# Patient Record
Sex: Male | Born: 1965
Health system: Southern US, Community
[De-identification: ages and names within clinical notes are randomized; demographics above are authoritative.]

## PROBLEM LIST (undated history)

## (undated) DIAGNOSIS — G4730 Sleep apnea, unspecified: Secondary | ICD-10-CM

## (undated) DIAGNOSIS — F17200 Nicotine dependence, unspecified, uncomplicated: Secondary | ICD-10-CM

## (undated) DIAGNOSIS — C44310 Basal cell carcinoma of skin of unspecified parts of face: Secondary | ICD-10-CM

## (undated) DIAGNOSIS — J342 Deviated nasal septum: Secondary | ICD-10-CM

## (undated) DIAGNOSIS — E785 Hyperlipidemia, unspecified: Secondary | ICD-10-CM

## (undated) HISTORY — DX: Nicotine dependence, unspecified, uncomplicated: F17.200

## (undated) HISTORY — PX: WISDOM TOOTH EXTRACTION: SHX21

## (undated) HISTORY — DX: Basal cell carcinoma of skin of unspecified parts of face: C44.310

## (undated) HISTORY — DX: Hyperlipidemia, unspecified: E78.5

## (undated) HISTORY — DX: Sleep apnea, unspecified: G47.30

## (undated) HISTORY — DX: Deviated nasal septum: J34.2

## (undated) HISTORY — PX: VASECTOMY: SHX75

---

## 2004-09-28 ENCOUNTER — Ambulatory Visit: Payer: Self-pay | Admitting: Internal Medicine

## 2005-03-15 ENCOUNTER — Ambulatory Visit: Payer: Self-pay | Admitting: Family Medicine

## 2008-09-06 ENCOUNTER — Encounter: Payer: Self-pay | Admitting: Family Medicine

## 2009-01-06 ENCOUNTER — Encounter: Payer: Self-pay | Admitting: Family Medicine

## 2009-05-09 ENCOUNTER — Encounter: Payer: Self-pay | Admitting: Family Medicine

## 2010-03-05 ENCOUNTER — Ambulatory Visit: Payer: Self-pay | Admitting: Family Medicine

## 2010-03-05 DIAGNOSIS — R5381 Other malaise: Secondary | ICD-10-CM

## 2010-03-05 DIAGNOSIS — R5383 Other fatigue: Secondary | ICD-10-CM

## 2010-04-02 ENCOUNTER — Emergency Department (HOSPITAL_COMMUNITY): Admission: EM | Admit: 2010-04-02 | Discharge: 2010-04-02 | Payer: Self-pay | Admitting: Emergency Medicine

## 2010-04-04 ENCOUNTER — Telehealth: Payer: Self-pay | Admitting: Family Medicine

## 2010-09-12 NOTE — Letter (Signed)
Summary: [facility]   Imported By: Lanelle Bal 03/22/2010 13:57:03  _____________________________________________________________________  External Attachment:    Type:   Image     Comment:   External Document

## 2010-09-12 NOTE — Assessment & Plan Note (Signed)
Summary: NEW PT TO EST,CPX/CLE   Vital Signs:  Patient profile:   45 year old male Height:      71.75 inches Weight:      232.50 pounds BMI:     31.87 Temp:     98.6 degrees F oral Pulse rate:   66 / minute Pulse rhythm:   regular BP sitting:   140 / 86  (left arm) Cuff size:   large  Vitals Entered By: Janee Morn CMA (March 05, 2010 8:52 AM) CC: CPx w/tetanus   History of Present Illness: 45 year old male for CPX.  tob, about a pack a day  had some HA with intercourse    Has had low platelets due to clumping in the past  Preventive Screening-Counseling & Management  Alcohol-Tobacco     Alcohol drinks/day: 0     Smoking Status: current     Smoking Cessation Counseling: yes     Smoke Cessation Stage: ready     Packs/Day: 1.0  Caffeine-Diet-Exercise     Diet Counseling: to improve diet; diet is suboptimal     Exercise Counseling: to improve exercise regimen  Hep-HIV-STD-Contraception     HIV Risk: no risk noted     STD Risk: no risk noted     Testicular SE Education/Counseling to perform regular STE     Sun Exposure Counseling: to decrease sun exposure      Sexual History:  currently monogamous.        Drug Use:  never.    Allergies (verified): No Known Drug Allergies  Past History:  Past Medical History: BCC, facial Hyperlipidemia  Past Surgical History: Vasectomy, Cope  Social History: Married Psychologist, occupational + Tob, no alcohol, drugsSmoking Status:  current Packs/Day:  1.0 HIV Risk:  no risk noted STD Risk:  no risk noted Sexual History:  currently monogamous Drug Use:  never  Review of Systems  General: Denies fever, chills, sweats, and anorexia. Eyes: Denies blurring. ENT: Denies earache, ear discharge, decreased hearing, nasal congestion, and sore throat. CV: Denies chest pains, dyspnea on exertion, palpitations, and syncope. Resp: Denies cough, cough with exercise, dyspnea at rest, excessive sputum, nighttime cough or wheeze, and wheezing GI:  Denies nausea, vomiting, diarrhea, constipation, change in bowel habits, abdominal pain, melena, BRBPR  GU: dysuria, discharge, frequency,genital sores, STD concern. MS: no back pain, joint pain, stiffness, and arthritis. Derm: No rash, itching, and dryness Neuro: No abnormal gait, frequent headaches, paresthesias, seizures, vertigo, and weakness Psych: No anxiety, behavioral problems, compulsive behavior, depression, hyperactivity, and inattentive. Endo: No polydipsia, polyphagia, polyuria, and unusual weight change Heme: No bruising or LAD Allergy: No urticaria or hayfever   Otherwise, the pertinent positives and negatives are listed above and in the HPI, otherwise a full review of systems has been reviewed and is negative unless noted positive.    Impression & Recommendations:  Problem # 1:  HEALTH MAINTENANCE EXAM (ICD-V70.0) The patient's preventative maintenance and recommended screening tests for an annual wellness exam were reviewed in full today. Brought up to date unless services declined.  Counselled on the importance of diet, exercise, and its role in overall health and mortality. The patient's FH and SH was reviewed, including their home life, tobacco status, and drug and alcohol status.   stop smoking  Complete Medication List: 1)  Niacin 500 Mg Tabs (Niacin) .Marland Kitchen.. 1 once daily  Other Orders: TLB-Lipid Panel (80061-LIPID) TLB-BMP (Basic Metabolic Panel-BMET) (80048-METABOL) TLB-CBC Platelet - w/Differential (85025-CBCD) TLB-Hepatic/Liver Function Pnl (80076-HEPATIC) TLB-PSA (Prostate Specific  Antigen) (84153-PSA) Venipuncture (16109)  Current Allergies (reviewed today): No known allergies    General Medical Physical Exam:  General Appearance:      Well developed, well nourished, in no acute distress  Head:      Inspection:     normocephalic without obvious abnormalities      Palpation:     no abnormal lesions palpable  Eyes:      External:      conjunctiva and lids normal      Pupils:     equal, round, and reactive to light and accommodation  Ears, Nose, Throat:      External:     no significant lesions or deformities noted      Otoscopic:     canals clear; tympanic membranes intact with normal light reflex      Hearing:     grossly intact      Nasal:     mucosa, septum, and turbinates normal      Dental:     good dentition      Pharynx:     tongue normal; posterior pharynx without erythema or exudate  Neck:      Neck:     supple; no masses; trachea midline      Thyroid:     no nodules, masses, tenderness, or enlargement  Respiratory:      Resp. effort:     no intercostal retractions or use of accessory muscles      Percussion:     no dullness      Palpation:     normal fremitus      Auscultation:     no rales, rhonchi, or wheezes  Chest Wall:      Chest wall:     no masses or gynecomastia      Axilla:     no axillary adenopathy  Cardiovascular:      Palpation:     no thrill or displacement of PMI      Auscultation:     normal S1 and  S2; no murmur, rub, or gallop  Gastrointestinal:      Abdomen:     soft and non-tender with normal bowel sounds; no masses      Liver/spleen:     normal to percussion; no enlargement or nodularity      Hernia:     no hernias      Rectal:     no masses or tenderness      Stool:     not done  Genitourinary:      Scrotum:     no lesions, cysts, edema, or rash      Penis:     no lesions or discharge      Prostate:     no enlargement or nodularity  Musculoskeletal:      Gait/station:     normal gait; no ataxia      Digits/nails:     no cyanosis, clubbing, or petechiae  Lymphatic:      Neck:     no cervical adenopathy      Inguinal:     no inguinal adenopathy  Skin:      some sunburn  Neurological:      Sensory:     intact to touch  Psychiatric:      Judgement:     intact      Orientation:     oriented to time, place, and person      Memory:  intact for recent and remote  events      Mood/affect:     no appearance of anxiety, depression, or agitation  Appended Document: NEW PT TO EST,CPX/CLE   Appended Document: NEW PT TO EST,CPX/CLE Call  read chol -- everything else is OK  When it is that high, you really should start more cholesterol medicine I know he is on Niacin  Has he ever been on Zocor, lipitor, or one of those medicines?  Appended Document: NEW PT TO EST,CPX/CLE Patient has never used these medication. Patient also stopped the Niacin. He would like to try that again.Consuello Masse CMA    Appended Document: NEW PT TO EST,CPX/CLE Call  The standard of care worldwide would be to use a statin drug as first treatment  Zocor 40 mg, 1 by mouth at bedtime, #30, 11 refills  Call in to the pharmacy of their choice Call in #30, 11 refills. OR if they prefer a 90 day supply, #90 with 3 refills is OK, too Prescription instructions above   Appended Document: NEW PT TO EST,CPX/CLE Patient does not want to take statin b/c of side effects his father had.Heather Humberto Leep CMA    Appended Document: NEW PT TO EST,CPX/CLE    Clinical Lists Changes  Orders: Added new Service order of Tdap => 22yrs IM (16109) - Signed Added new Service order of Admin 1st Vaccine (60454) - Signed Observations: Added new observation of TD BOOST VIS: 06/30/07 version given March 08, 2010. (03/08/2010 9:08) Added new observation of TD BOOSTERLO: [id number] (03/08/2010 9:08) Added new observation of TD BOOST EXP: 11/04/2011 (03/08/2010 9:08) Added new observation of TD BOOSTERBY: Heather Woodard CMA (AAMA) (03/08/2010 9:08) Added new observation of TD BOOSTERRT: IM (03/08/2010 9:08) Added new observation of TDBOOSTERDSE: 0.5 ml (03/08/2010 9:08) Added new observation of TD BOOSTERMF: GlaxoSmithKline (03/08/2010 9:08) Added new observation of TD BOOST SIT: left deltoid (03/08/2010 9:08) Added new observation of TD BOOSTER: Tdap (03/08/2010  9:08)       Immunizations Administered:  Tetanus Vaccine:    Vaccine Type: Tdap    Site: left deltoid    Mfr: GlaxoSmithKline    Dose: 0.5 ml    Route: IM    Given by: Benny Lennert CMA (AAMA)    Exp. Date: 11/04/2011    Lot #: [id number]    VIS given: 06/30/07 version given March 08, 2010.

## 2010-09-12 NOTE — Letter (Signed)
Summary: Patient Questionnaire  Patient Questionnaire   Imported By: Beau Fanny 03/05/2010 10:22:59  _____________________________________________________________________  External Attachment:    Type:   Image     Comment:   External Document

## 2010-09-12 NOTE — Progress Notes (Signed)
Summary: call a nurse  Phone Note Call from Patient   Summary of Call: Triage Record Num: [id number] Operator: Caswell Corwin Patient Name: Brandon Edwards Call Date & Time: 04/02/2010 8:31:14PM Patient Phone: [phone] PCP: Hannah Beat Patient Gender: Male PCP Fax : [phone] Patient DOB: Dec 18, 1965 Practice Name[facility] Reason for Call: Wife Bjorn Loser calling that pt has a fever of 101.0 after lunch today , multiple tick bites that he got over the past 2 weeks. In the past month has had 40 -50 bites. Pt also has poison ivy. Pulled a ticj from umbilicus and now has a red rash around this. Triaged bites and stings and inst to be seen in the E/R in the next 4 hrs for eval. Protocol(s) Used: Bites and Stings - Insects or Spiders Recommended Outcome per Protocol: See Provider within 4 hours Reason for Outcome: History of tick bite AND now has rash, fever, headache, joint or muscle pain or swollen lymph glands Care Advice: Analgesic/Antipyretic Advice - Acetaminophen: Consider acetaminophen as directed on label or by pharmacist/provider for pain or fever PRECAUTIONS: - Use if there is no history of liver disease, alcoholism, or intake of three or more alcohol drinks per day - Only if approved by provider during pregnancy or when breastfeeding - During pregnancy, acetaminophen should not be taken more than 3 consecutive days without telling provider - Do not exceed recommended dose or frequency  ~ Analgesic/Antipyretic Advice - NSAIDs: Consider aspirin, ibuprofen, naproxen or ketoprofen for pain or fever as directed on label or by pharmacist/provider. PRECAUTIONS: - If over 63 years of age, should not take longer than 1 week without consulting provider. EXCEPTIONS: - Should not be used if taking blood thinners or have bleeding problems. - Do not use if have history of sensitivity/allergy to any of these medications; or history of cardiovascular, ulcer, kidney,  liver disease or diabetes unless approved by provider. - Do not exceed recommended dose or frequency.  ~ 04/03/2010 10:17:34AM Page Initial call taken by: Melody Comas,  April 04, 2010 8:33 AM  Follow-up for Phone Call        Make sure this patient has an appointment today -- if he went to ER or something last night, that is OK. Has to have medical attention today  I have openings Follow-up by: Hannah Beat MD,  April 04, 2010 8:42 AM  Additional Follow-up for Phone Call Additional follow up Details #1::        Patient went to er on monday night and was not at home today.Consuello Masse CMA   Additional Follow-up by: Benny Lennert CMA Duncan Dull),  April 04, 2010 8:44 AM

## 2010-09-12 NOTE — Letter (Signed)
Summary: [facility]   Imported By: Lanelle Bal 03/22/2010 13:56:23  _____________________________________________________________________  External Attachment:    Type:   Image     Comment:   External Document

## 2010-09-12 NOTE — Letter (Signed)
Summary: [facility]   Imported By: Lanelle Bal 03/22/2010 13:58:36  _____________________________________________________________________  External Attachment:    Type:   Image     Comment:   External Document

## 2010-11-05 ENCOUNTER — Encounter: Payer: Self-pay | Admitting: Family Medicine

## 2010-11-06 ENCOUNTER — Ambulatory Visit: Payer: Self-pay | Admitting: Internal Medicine

## 2010-11-09 ENCOUNTER — Ambulatory Visit (INDEPENDENT_AMBULATORY_CARE_PROVIDER_SITE_OTHER): Payer: 59 | Admitting: Family Medicine

## 2010-11-09 ENCOUNTER — Encounter: Payer: Self-pay | Admitting: Family Medicine

## 2010-11-09 DIAGNOSIS — G4730 Sleep apnea, unspecified: Secondary | ICD-10-CM | POA: Insufficient documentation

## 2010-11-09 DIAGNOSIS — E785 Hyperlipidemia, unspecified: Secondary | ICD-10-CM | POA: Insufficient documentation

## 2010-11-09 NOTE — Progress Notes (Signed)
45 year old male:  1. H/o sleep apnea dx 15 years ago Choking at night, snores Poor rest Feels tired all the time Wife says he stops breathing Never used CPAP, no surgery  2. Recheck Chol: was elevated, doing well with diet.  ROS: GEN: No acute illnesses, no fevers, chills. GI: No n/v/d, eating normally Pulm: No SOB Interactive and getting along well at home.  Otherwise, ROS is as per the HPI.   GEN: WDWN, NAD, Non-toxic, A & O x 3 HEENT: Atraumatic, Normocephalic. Neck supple. No masses, No LAD. Ears and Nose: No external deformity. CV: RRR, No M/G/R. No JVD. No thrill. No extra heart sounds. PULM: CTA B, no wheezes, crackles, rhonchi. No retractions. No resp. distress. No accessory muscle use. EXTR: No c/c/e NEURO Normal gait.  PSYCH: Normally interactive. Conversant. Not depressed or anxious appearing.  Calm demeanor.   A/p: Sleep apnea: consult sleep med Chol: recheck FLP

## 2010-11-09 NOTE — Progress Notes (Signed)
Addended by: Adriana Simas on: 11/09/2010 11:06 AM   Modules accepted: Orders

## 2010-11-09 NOTE — Patient Instructions (Signed)
REFERRAL: GO THE THE FRONT ROOM AT THE ENTRANCE OF OUR CLINIC, NEAR CHECK IN. ASK FOR MARION. SHE WILL HELP YOU SET UP YOUR REFERRAL. DATE: TIME:  

## 2010-11-12 DIAGNOSIS — Z85828 Personal history of other malignant neoplasm of skin: Secondary | ICD-10-CM | POA: Insufficient documentation

## 2010-11-28 ENCOUNTER — Encounter: Payer: Self-pay | Admitting: Family Medicine

## 2010-12-04 ENCOUNTER — Encounter: Payer: Self-pay | Admitting: Pulmonary Disease

## 2010-12-06 ENCOUNTER — Ambulatory Visit (INDEPENDENT_AMBULATORY_CARE_PROVIDER_SITE_OTHER): Payer: 59 | Admitting: Pulmonary Disease

## 2010-12-06 ENCOUNTER — Encounter: Payer: Self-pay | Admitting: Pulmonary Disease

## 2010-12-06 VITALS — BP 130/80 | HR 57 | Temp 98.2°F | Ht 73.0 in | Wt 232.0 lb

## 2010-12-06 DIAGNOSIS — G4733 Obstructive sleep apnea (adult) (pediatric): Secondary | ICD-10-CM | POA: Insufficient documentation

## 2010-12-06 NOTE — Patient Instructions (Signed)
We will try to locate your old sleep study and see if insurance will allow [place] to order cpap with the data.  If not, or if we cannot locate the sleep study,  will need to have this repeated. Will contact you once we have the study for review.

## 2010-12-06 NOTE — Assessment & Plan Note (Signed)
The pt's history is very suggestive of osa.  He has loud snoring, witnessed apneas, and nonrestorative sleep.  He also has definite daytime sleepiness with inactivity.  Will try and locate his old study, and initiate cpap therapy if insurance will allow.  They may request a f/u study.  I have had a long discussion with the pt about sleep apnea, including its impact on QOL and CV health.  Will contact him once study available.

## 2010-12-06 NOTE — Progress Notes (Signed)
  Subjective:    Patient ID: Brandon Edwards, male    DOB: 1965-08-21, 45 y.o.   MRN: [id number]  HPI The pt is a 44y/o male who comes in today for management of osa.  He was diagnosed with osa 6yrs ago in [place], and surgery was recommended by ENT for treatment.  The pt decided against this, and has not had further followup.  He comes in today where he feels his symptoms have worsened, and he has gained 20 pounds since his sleep study.  His history is significant for:  -loud snoring, observed apneas -nonrestorative sleep upon arising - definite sleep pressure during periods of inactivity, and driving longer distances -[place] 9 today  Sleep Questionnaire: What time do you typically go to bed?( Between what hours) 11pm to 1 am How long does it take you to fall asleep? seconds How many times during the night do you wake up? 0 What time do you get out of bed to start your day? 0530 Do you drive or operate heavy machinery in your occupation? Yes How much has your weight changed (up or down) over the past two years? (In pounds) 10 lb (4.536 kg) Have you ever had a sleep study before? Yes If yes, location of study? [facility] If yes, date of study? approx 15 years ago Do you currently use CPAP? No Do you wear oxygen at any time? No    Review of Systems  Constitutional: Negative for fever and unexpected weight change.  HENT: Positive for congestion. Negative for ear pain, nosebleeds, sore throat, rhinorrhea, sneezing, trouble swallowing, dental problem, postnasal drip and sinus pressure.   Eyes: Negative for redness and itching.  Respiratory: Negative for cough, chest tightness, shortness of breath and wheezing.   Cardiovascular: Negative for palpitations and leg swelling.  Gastrointestinal: Negative for nausea and vomiting.  Genitourinary: Negative for dysuria.  Musculoskeletal: Negative for joint swelling.  Skin: Negative for rash.  Neurological: Negative for headaches.  Hematological: Does not  bruise/bleed easily.  Psychiatric/Behavioral: Negative for dysphoric mood. The patient is not nervous/anxious.        Objective:   Physical Exam Constitutional:  Well developed, no acute distress  HENT:  Nares with deviation to left with near obstruction, right with narrowing from turbinate hypertrophy  Oropharynx without exudate, palate and uvula are moderately elongated.  Eyes:  Perrla, eomi, no scleral icterus  Neck:  No JVD, no TMG  Cardiovascular:  Normal rate, regular rhythm, no rubs or gallops.  No murmurs        Intact distal pulses  Pulmonary :  Normal breath sounds, no stridor or respiratory distress   No rales, rhonchi, or wheezing  Abdominal:  Soft, nondistended, bowel sounds present.  No tenderness noted.   Musculoskeletal:  No lower extremity edema noted.  Lymph Nodes:  No cervical lymphadenopathy noted  Skin:  No cyanosis noted  Neurologic:  Alert, appropriate, moves all 4 extremities without obvious deficit.         Assessment & Plan:

## 2010-12-18 ENCOUNTER — Telehealth: Payer: Self-pay | Admitting: Pulmonary Disease

## 2010-12-18 NOTE — Telephone Encounter (Signed)
Please advise Aundra Millet if you ever received pt sleep study. Thanks  Carver Fila, CMA

## 2010-12-19 ENCOUNTER — Other Ambulatory Visit: Payer: Self-pay | Admitting: Pulmonary Disease

## 2010-12-19 DIAGNOSIS — G4733 Obstructive sleep apnea (adult) (pediatric): Secondary | ICD-10-CM

## 2010-12-19 NOTE — Telephone Encounter (Signed)
Patient calling back.   °

## 2010-12-19 NOTE — Telephone Encounter (Signed)
Let pt know that old study showed he had mild osa.  Has probably gotten worse over time.  Will send an order to [facility] to start cpap, and they will try and get precertified with old study.  Insurance may require a new study, and we should know this within a few days.  If insurance allows, will get [facility] to call him to let him know.

## 2010-12-19 NOTE — Telephone Encounter (Signed)
Yes. We did receive the sleep study report. I have put it in KC's very important look at folder for him to review.

## 2010-12-19 NOTE — Telephone Encounter (Signed)
Pt aware we will send order to start cpap and we will call if new sleep study is needed.  Pls advise on cpap settings.

## 2010-12-19 NOTE — Telephone Encounter (Signed)
Lm for pt to call back to advise we have received sleep study and will call once Dr. Shelle Iron has reviewed it.   Please advise Dr. Shelle Iron results are in your look at folder. Thanks  Carver Fila, CMA

## 2010-12-21 NOTE — Telephone Encounter (Signed)
The order was already sent to [facility] on 12/19/10

## 2010-12-24 ENCOUNTER — Encounter: Payer: Self-pay | Admitting: Pulmonary Disease

## 2010-12-27 ENCOUNTER — Emergency Department (HOSPITAL_COMMUNITY)
Admission: EM | Admit: 2010-12-27 | Discharge: 2010-12-28 | Disposition: A | Discharge status: Home / Self Care | Payer: 59 | Attending: Emergency Medicine | Admitting: Emergency Medicine

## 2010-12-27 DIAGNOSIS — W57XXXA Bitten or stung by nonvenomous insect and other nonvenomous arthropods, initial encounter: Secondary | ICD-10-CM | POA: Insufficient documentation

## 2010-12-27 DIAGNOSIS — E7800 Pure hypercholesterolemia, unspecified: Secondary | ICD-10-CM | POA: Insufficient documentation

## 2010-12-27 DIAGNOSIS — T148 Other injury of unspecified body region: Secondary | ICD-10-CM | POA: Insufficient documentation

## 2010-12-28 ENCOUNTER — Telehealth: Payer: Self-pay | Admitting: *Deleted

## 2010-12-28 NOTE — Telephone Encounter (Signed)
Triage Record Num: [id number] Operator: Ethlyn Gallery Patient Name: Brandon Edwards Call Date & Time: 12/27/2010 8:44:08PM Patient Phone: [phone] PCP: Hannah Beat Patient Gender: Male PCP Fax : [phone] Patient DOB: Nov 18, 1965 Practice Name: [facility] Reason for Call: [name]/pt called and stated he has insect bites on his arms, legs, chest, and back. He stated he noticed them 12/26/10 but don't know what type of insect bit him. He states sthe area are swollen 2-3x the size they were 12/26/10. He states blisters have formed and some are draining a clear drainage. Afebrile. He reports no itching or pain. All Emergent Sxs R/O Per Bites, and Stings - Insects or Spiders Protocol except increased swelling and blisters developing @ the bite sites. Pt advised to call office 12/28/10. Homecare advice given. Protocol(s) Used: Bites and Stings - Insects or Spiders Recommended Outcome per Protocol: Provide Home/Self Care Override Outcome if Used in Protocol: See Provider within 24 hours RN Reason for Override Outcome: Nursing Judgement Used. Reason for Outcome: All other situations Care Advice: Call EMS 911 if any of the following occur within 24 hours of bite/sting: loss of consciousness, sudden onset of difficulty breathing or wheezing, chest pain or tightness, throat tightness, severe swelling of parts of the body (e.g., eyes, lips, or tongue) other than bite/sting site, abdominal cramps. ~ If, more than 24 hours after the incident, the sting/bite site or area around sting/bite site becomes increasingly swollen, red or painful, or has a purulent or foul smelling discharge, or red streaks develop leading away from the site, call provider immediately. ~ Apply cloth-covered ice pack or a cool compress to the area for no more than 20 minutes 4-8 times a day while awake to reduce pain and swelling. ~ CDC does not recommend tetanus prophylaxis for insect bites. But this can be a good  time to check and confirm that tetanus is up to date. ~ ~ SYMPTOM / CONDITION MANAGEMENT ~ CAUTIONS Analgesic/Antipyretic Advice - Acetaminophen: Consider acetaminophen as directed on label or by pharmacist/provider for pain or fever PRECAUTIONS: - Use if there is no history of liver disease, alcoholism, or intake of three or more alcohol drinks per day - Only if approved by provider during pregnancy or when breastfeeding - During pregnancy, acetaminophen should not be taken more than 3 consecutive days without telling provider - Do not exceed recommended dose or frequency ~ 12/27/2010 9:17:16PM Page 1 of 1 [id number]

## 2010-12-28 NOTE — Telephone Encounter (Signed)
Patient called to let you know that he did go to [facility] ER last night and he had 15-20 spider bites all over his body. He was treated with antibiotic and sent home.

## 2010-12-28 NOTE — Telephone Encounter (Signed)
noted 

## 2011-03-15 ENCOUNTER — Ambulatory Visit: Payer: 59 | Admitting: Pulmonary Disease

## 2011-03-25 ENCOUNTER — Encounter: Payer: Self-pay | Admitting: Pulmonary Disease

## 2011-03-25 ENCOUNTER — Ambulatory Visit (INDEPENDENT_AMBULATORY_CARE_PROVIDER_SITE_OTHER): Payer: 59 | Admitting: Pulmonary Disease

## 2011-03-25 VITALS — BP 130/82 | HR 67 | Temp 97.6°F | Ht 73.0 in | Wt 228.8 lb

## 2011-03-25 DIAGNOSIS — G4733 Obstructive sleep apnea (adult) (pediatric): Secondary | ICD-10-CM

## 2011-03-25 NOTE — Patient Instructions (Signed)
Will put your machine on auto mode for next 2 weeks to see if you have improvement.  We can get download to determine your set pressure.  You can also choose at that time to leave your machine on auto if more comfortable. Will look at mask leak data next download to see if persistent.  Will call you with results of your download.

## 2011-03-25 NOTE — Progress Notes (Signed)
  Subjective:    Patient ID: Brandon Edwards, male    DOB: [birth year], 45 y.o.   MRN: [id number]  HPI The pt comes in today for f/u of his known mild osa.  He has been started on cpap, and is wearing about 4hrs a night.  He is using a full face mask, but his download today shows some leak.  The pt is not aware of this.  He is having no issues with pressure, but does not feel he is sleeping any better.  He is having breakthru snoring.  I have reminded him that we have yet to optimize his pressure.     Review of Systems  Constitutional: Negative for fever and unexpected weight change.  HENT: Negative for ear pain, nosebleeds, congestion, sore throat, rhinorrhea, sneezing, trouble swallowing, dental problem, postnasal drip and sinus pressure.   Eyes: Negative for redness and itching.  Respiratory: Negative for cough, chest tightness, shortness of breath and wheezing.   Cardiovascular: Negative for palpitations and leg swelling.  Gastrointestinal: Negative for nausea and vomiting.  Genitourinary: Negative for dysuria.  Musculoskeletal: Negative for joint swelling.  Skin: Negative for rash.  Neurological: Negative for headaches.  Hematological: Does not bruise/bleed easily.  Psychiatric/Behavioral: Negative for dysphoric mood. The patient is not nervous/anxious.        Objective:   Physical Exam Ow male in nad No skin breakdown or pressure necrosis from cpap mask LE without edema, no cyanosis noted. Alert, but does appear mildly sleepy, moves all 4        Assessment & Plan:

## 2011-03-25 NOTE — Assessment & Plan Note (Signed)
The patient has been started on CPAP, and is only wearing somewhat compliantly.  He is only averaging about 4 hours a night, and I told him he should try and achieve at least 6 hours and night.  He denies any issues with mask fit or pressure, but is having breakthrough snoring.  We have yet to optimize his pressure for him, and this is the next step.  I also noted that he is having some mask leak on the download, and we'll need to troubleshoot this issue as well.  At this point I think we need to put his machine on automatic mode, and get a download in the next 2 weeks to look at some of these variables.  If he continues to be symptomatic despite optimizing CPAP, we'll have to discuss other therapeutic options including surgery and dental appliance.

## 2011-04-03 ENCOUNTER — Telehealth: Payer: Self-pay | Admitting: Pulmonary Disease

## 2011-04-03 NOTE — Telephone Encounter (Signed)
Order was sent on 03-25-11. [facility] closed so will call them in morning to ask about the order. Pt aware. Carron Curie, CMA

## 2011-04-04 NOTE — Telephone Encounter (Signed)
I spoke with Sherice at [facility] and she states they did receive the order but it was processed incorrectly so she will get the pt changed to auto today. She states this can be done remotely so he will not have to bring in his machine.  Pt wife aware. Carron Curie, CMA

## 2011-10-30 ENCOUNTER — Encounter: Payer: Self-pay | Admitting: Family Medicine

## 2011-10-30 ENCOUNTER — Ambulatory Visit (INDEPENDENT_AMBULATORY_CARE_PROVIDER_SITE_OTHER): Payer: 59 | Admitting: Family Medicine

## 2011-10-30 VITALS — BP 120/82 | HR 71 | Temp 97.9°F | Ht 73.0 in | Wt 228.1 lb

## 2011-10-30 DIAGNOSIS — H6690 Otitis media, unspecified, unspecified ear: Secondary | ICD-10-CM

## 2011-10-30 DIAGNOSIS — F17200 Nicotine dependence, unspecified, uncomplicated: Secondary | ICD-10-CM

## 2011-10-30 DIAGNOSIS — J320 Chronic maxillary sinusitis: Secondary | ICD-10-CM

## 2011-10-30 HISTORY — DX: Nicotine dependence, unspecified, uncomplicated: F17.200

## 2011-10-30 MED ORDER — AMOXICILLIN 875 MG PO TABS: 875.0000 mg | ORAL_TABLET | Freq: Two times a day (BID) | ORAL | Status: AC

## 2011-10-30 NOTE — Progress Notes (Signed)
  Patient Name: Brandon Edwards Date of Birth: Jun 14, 1966 Age: 46 y.o. Medical Record Number: [id number] Gender: male Date of Encounter: 10/30/2011  History of Present Illness:  Brandon Edwards is a 46 y.o. very pleasant male patient who presents with the following:  A lot of sinus congestion and yellowish discharge and headaches. Maybe a litte bit of fever. No aches.  Productive sputum. He feels mildly short of breath and that he is wheezing. Some sinus congestion and pressure. Some fullness in his ears. No frank pain in his ears. No sore throat. No nausea, vomiting, or diarrhea. 1 PPD for 30 years.     Past Medical History, Surgical History, Social History, Family History, Problem List, Medications, and Allergies have been reviewed and updated if relevant.  Review of Systems: ROS: GEN: Acute illness details above GI: Tolerating PO intake GU: maintaining adequate hydration and urination Pulm: No SOB Interactive and getting along well at home.  Otherwise, ROS is as per the HPI.   Physical Examination: Filed Vitals:   10/30/11 1208  BP: 120/82  Pulse: 71  Temp: 97.9 F (36.6 C)  TempSrc: Oral  Height: 6\' 1"  (1.854 m)  Weight: 228 lb 1.9 oz (103.475 kg)  SpO2: 99%    Body mass index is 30.10 kg/(m^2).   Gen: WDWN, NAD; A & O x3, cooperative. Pleasant.Globally Non-toxic HEENT: Normocephalic and atraumatic. Throat clear, w/o exudate, R indistinct landmarks and reddened, L TM - good landmarks, No fluid present. rhinnorhea.  MMM Frontal sinuses: NT Max sinuses: NT NECK: Anterior cervical  LAD is absent CV: RRR, No M/G/R, cap refill <2 sec PULM: Breathing comfortably in no respiratory distress. no wheezing, crackles, rhonchi EXT: No c/c/e PSYCH: Friendly, good eye contact MSK: Nml gait    Assessment and Plan:  1. Smoker   2. Maxillary sinusitis   3. Otitis media     Orders Today: No orders of the defined types were placed in this encounter.    Medications  Today: Meds ordered this encounter  Medications  . amoxicillin (AMOXIL) 875 MG tablet    Sig: Take 1 tablet (875 mg total) by mouth 2 (two) times daily.    Dispense:  20 tablet    Refill:  0    30 year pack history. Cover with amoxicillin. The patient's ear on the right looks like he may be having an early otitis media. Also gave him a Ventolin sample that I had in the office. He also has some significant sinus pressure and is going to take some over-the-counter cold medication.

## 2012-06-24 ENCOUNTER — Telehealth: Payer: Self-pay | Admitting: Family Medicine

## 2012-06-24 NOTE — Telephone Encounter (Signed)
Pt has an apptmt scheduled for a CPE on August 24, 2012 at 2:45 p.m. He wants to have his labs drawn at [facility]. Could you please write the order and let me know when it's ready so pt can pick it up? Thank you.

## 2012-06-25 MED ORDER — NONFORMULARY OR COMPOUNDED ITEM: Status: DC

## 2012-06-25 NOTE — Telephone Encounter (Signed)
Confirm if he wants a nicotine metabolite test done - [employer] employees usually need this for insurance.

## 2012-06-25 NOTE — Telephone Encounter (Signed)
Ready - i included the nicotine metabolite that [employer] typically needs   Hannah Beat, MD 06/25/2012, 4:42 PM

## 2012-06-25 NOTE — Telephone Encounter (Signed)
Left message for patient's wife to return my call

## 2012-08-24 ENCOUNTER — Ambulatory Visit (INDEPENDENT_AMBULATORY_CARE_PROVIDER_SITE_OTHER): Payer: 59 | Admitting: Family Medicine

## 2012-08-24 ENCOUNTER — Encounter: Payer: Self-pay | Admitting: Family Medicine

## 2012-08-24 VITALS — BP 120/72 | HR 72 | Temp 97.8°F | Ht 73.0 in | Wt 232.2 lb

## 2012-08-24 DIAGNOSIS — Z0000 Encounter for general adult medical examination without abnormal findings: Secondary | ICD-10-CM

## 2012-08-24 DIAGNOSIS — E785 Hyperlipidemia, unspecified: Secondary | ICD-10-CM

## 2012-08-24 DIAGNOSIS — Z125 Encounter for screening for malignant neoplasm of prostate: Secondary | ICD-10-CM

## 2012-08-24 NOTE — Progress Notes (Signed)
Nature conservation officer at [facility] [address] [place] [address] Phone: [phone] Fax: [phone]  Date:  08/24/2012   Name:  Brandon Edwards   DOB:  May 19, 1966   MRN:  [id number] Gender: male Age: 47 y.o.  PCP:  Hannah Beat, MD  Evaluating MD: Hannah Beat, MD   Chief Complaint: Annual Exam   History of Present Illness:  Brandon Edwards is a 47 y.o. pleasant patient who presents with the following:  Preventative Health Maintenance Visit:  Health Maintenance Summary Reviewed and updated, unless pt declines services.  Tobacco History Reviewed. 1 PPD and precontemplative Alcohol: No concerns, no excessive use Exercise Habits: minimal  STD concerns: no risk or activity to increase risk Drug Use: None Encouraged self-testicular check  Health Maintenance  Topic Date Due  . Influenza Vaccine  04/12/2012  . Tetanus/tdap  03/08/2020  Declines flu shot  Needs labs  Patient Active Problem List  Diagnosis  . FATIGUE  . Hyperlipidemia  . OSA (obstructive sleep apnea)  . Smoker    Past Medical History  Diagnosis Date  . BCC (basal cell carcinoma), face   . Hyperlipidemia   . Sleep apnea   . Smoker 10/30/2011    Past Surgical History  Procedure Date  . Vasectomy     cope    History  Substance Use Topics  . Smoking status: Current Every Day Smoker -- 1.0 packs/day for 30 years    Types: Cigarettes  . Smokeless tobacco: Not on file  . Alcohol Use: No    Family History  Problem Relation Age of Onset  . Allergies Mother   . Allergies Father   . Allergies Brother   . Allergies Brother   . Skin cancer Paternal Grandfather     No Known Allergies  Medication list has been reviewed and updated.  Review of Systems:   General: Denies fever, chills, sweats. No significant weight loss. Eyes: Denies blurring,significant itching ENT: Denies earache, sore throat, and hoarseness. Cardiovascular: Denies chest pains, palpitations, dyspnea  on exertion Respiratory: Denies cough, dyspnea at rest,wheeezing Breast: no concerns about lumps GI: Denies nausea, vomiting, diarrhea, constipation, change in bowel habits, abdominal pain, melena, hematochezia GU: Denies penile discharge, ED, urinary flow / outflow problems. No STD concerns. Musculoskeletal: Denies back pain, joint pain Derm: Denies rash, itching Neuro: Denies  paresthesias, frequent falls, frequent headaches Psych: Denies depression, anxiety Endocrine: Denies cold intolerance, heat intolerance, polydipsia Heme: Denies enlarged lymph nodes Allergy: No hayfever. SOME SINUS CONGESTION.   Physical Examination: BP 120/72  Pulse 72  Temp 97.8 F (36.6 C) (Oral)  Ht 6\' 1"  (1.854 m)  Wt 232 lb 4 oz (105.348 kg)  BMI 30.64 kg/m2  SpO2 98%  Ideal Body Weight: Weight in (lb) to have BMI = 25: 189.1    Wt Readings from Last 3 Encounters:  08/24/12 232 lb 4 oz (105.348 kg)  10/30/11 228 lb 1.9 oz (103.475 kg)  03/25/11 228 lb 12.8 oz (103.783 kg)    GEN: well developed, well nourished, no acute distress Eyes: conjunctiva and lids normal, PERRLA, EOMI ENT: TM clear, nares clear, oral exam WNL Neck: supple, no lymphadenopathy, no thyromegaly, no JVD Pulm: clear to auscultation and percussion, respiratory effort normal CV: regular rate and rhythm, S1-S2, no murmur, rub or gallop, no bruits, peripheral pulses normal and symmetric, no cyanosis, clubbing, edema or varicosities Chest: no scars, masses GI: soft, non-tender; no hepatosplenomegaly, masses; active bowel sounds all quadrants GU: no hernia, testicular mass, penile discharge,  or prostate enlargement Lymph: no cervical, axillary or inguinal adenopathy MSK: gait normal, muscle tone and strength WNL, no joint swelling, effusions, discoloration, crepitus  SKIN: clear, good turgor, color WNL, no rashes, lesions, or ulcerations Neuro: normal mental status, normal strength, sensation, and motion Psych: alert; oriented  to person, place and time, normally interactive and not anxious or depressed in appearance.  Assessment and Plan:  1. Routine general medical examination at a health care facility  Basic metabolic panel, CBC with Differential, Hepatic function panel  2. Other and unspecified hyperlipidemia  LDL cholesterol, direct  3. Special screening for malignant neoplasm of prostate  PSA   The patient's preventative maintenance and recommended screening tests for an annual wellness exam were reviewed in full today. Brought up to date unless services declined.  Counselled on the importance of diet, exercise, and its role in overall health and mortality. The patient's FH and SH was reviewed, including their home life, tobacco status, and drug and alcohol status.   Work on tobacco and exercise.  Declines flu  Orders Today:  Orders Placed This Encounter  Procedures  . Basic metabolic panel  . CBC with Differential  . LDL cholesterol, direct  . Hepatic function panel  . PSA    Updated Medication List: (Includes new medications, updates to list, dose adjustments) No orders of the defined types were placed in this encounter.    Medications Discontinued: Medications Discontinued During This Encounter  Medication Reason  . NONFORMULARY OR COMPOUNDED ITEM Error     Hannah Beat, MD

## 2012-08-25 LAB — CBC WITH DIFFERENTIAL/PLATELET
Basos: 1 % (ref 0–3)
Eosinophils Absolute: 0.2 10*3/uL (ref 0.0–0.4)
Immature Grans (Abs): 0 10*3/uL (ref 0.0–0.1)
Immature Granulocytes: 0 % (ref 0–2)
MCH: 29.8 pg (ref 26.6–33.0)
MCHC: 34.1 g/dL (ref 31.5–35.7)
MCV: 87 fL (ref 79–97)
Monocytes Absolute: 0.6 10*3/uL (ref 0.1–0.9)
Neutrophils Relative %: 53 % (ref 40–74)
RDW: 13.3 % (ref 12.3–15.4)

## 2012-08-25 LAB — HEPATIC FUNCTION PANEL
AST: 17 IU/L (ref 0–40)
Albumin: 4.5 g/dL (ref 3.5–5.5)
Alkaline Phosphatase: 111 IU/L (ref 39–117)

## 2012-08-25 LAB — BASIC METABOLIC PANEL
BUN/Creatinine Ratio: 12 (ref 9–20)
Chloride: 99 mmol/L (ref 97–108)
GFR calc Af Amer: 113 mL/min/{1.73_m2} (ref >59)
Potassium: 4.8 mmol/L (ref 3.5–5.2)
Sodium: 140 mmol/L (ref 134–144)

## 2013-04-21 ENCOUNTER — Ambulatory Visit (INDEPENDENT_AMBULATORY_CARE_PROVIDER_SITE_OTHER): Payer: 59 | Admitting: Family Medicine

## 2013-04-21 ENCOUNTER — Encounter: Payer: Self-pay | Admitting: Family Medicine

## 2013-04-21 VITALS — BP 124/78 | HR 60 | Temp 97.8°F | Ht 73.0 in | Wt 244.5 lb

## 2013-04-21 DIAGNOSIS — R739 Hyperglycemia, unspecified: Secondary | ICD-10-CM

## 2013-04-21 DIAGNOSIS — R7309 Other abnormal glucose: Secondary | ICD-10-CM

## 2013-04-21 DIAGNOSIS — E785 Hyperlipidemia, unspecified: Secondary | ICD-10-CM

## 2013-04-21 NOTE — Progress Notes (Signed)
Nature conservation officer at [facility] [address] [place] [address] Phone: [phone] Fax: [phone]  Date:  04/21/2013   Name:  Brandon Edwards   DOB:  1965-09-07   MRN:  [id number] Gender: male Age: 47 y.o.  Primary Physician:  Hannah Beat, MD  Evaluating MD: Hannah Beat, MD   Chief Complaint: Discuss lab work   History of Present Illness:  Brandon Edwards is a 47 y.o. pleasant patient who presents with the following:  On screening labs for insurance had a1c of 6.4 and elevated cholesterol - wants to get rechecked  Patient Active Problem List   Diagnosis Date Noted  . Smoker 10/30/2011  . OSA (obstructive sleep apnea) 12/06/2010  . Hyperlipidemia 11/09/2010  . FATIGUE 03/05/2010    Past Medical History  Diagnosis Date  . BCC (basal cell carcinoma), face   . Hyperlipidemia   . Sleep apnea   . Smoker 10/30/2011    Past Surgical History  Procedure Laterality Date  . Vasectomy      cope    History   Social History  . Marital Status: Married    Spouse Name: N/A    Number of Children: Y  . Years of Education: N/A   Occupational History  . welder     self-employed  .     Social History Main Topics  . Smoking status: Former Smoker -- 1.00 packs/day for 30 years    Types: Cigarettes  . Smokeless tobacco: Never Used     Comment: July 2014  . Alcohol Use: No  . Drug Use: No  . Sexual Activity: Not on file   Other Topics Concern  . Not on file   Social History Narrative  . No narrative on file    Family History  Problem Relation Age of Onset  . Allergies Mother   . Allergies Father   . Allergies Brother   . Allergies Brother   . Skin cancer Paternal Grandfather     No Known Allergies  Medication list has been reviewed and updated.  No outpatient prescriptions prior to visit.   No facility-administered medications prior to visit.    Review of Systems:   GEN: No acute illnesses, no fevers, chills. GI: No n/v/d,  eating normally Pulm: No SOB Interactive and getting along well at home.  Otherwise, ROS is as per the HPI.   Physical Examination: BP 124/78  Pulse 60  Temp(Src) 97.8 F (36.6 C) (Oral)  Ht 6\' 1"  (1.854 m)  Wt 244 lb 8 oz (110.904 kg)  BMI 32.26 kg/m2  Ideal Body Weight: Weight in (lb) to have BMI = 25: 189.1   GEN: WDWN, NAD, Non-toxic, A & O x 3 HEENT: Atraumatic, Normocephalic. Neck supple. No masses, No LAD. Ears and Nose: No external deformity. CV: RRR, No M/G/R. No JVD. No thrill. No extra heart sounds. PULM: CTA B, no wheezes, crackles, rhonchi. No retractions. No resp. distress. No accessory muscle use. EXTR: No c/c/e NEURO Normal gait.  PSYCH: Normally interactive. Conversant. Not depressed or anxious appearing.  Calm demeanor.    Assessment and Plan:  Hyperglycemia - Plan: Hemoglobin A1c  Other and unspecified hyperlipidemia - Plan: Lipid panel  F/u labs  Orders Today:  Orders Placed This Encounter  Procedures  . Lipid panel  . Hemoglobin A1c    Updated Medication List: (Includes new medications, updates to list, dose adjustments) Meds ordered this encounter  Medications  . Multiple Vitamin (MULTIVITAMIN) tablet    Sig: Take  1 tablet by mouth daily.  . APPLE CIDER VINEGAR PO    Sig: Take by mouth daily.    Medications Discontinued: There are no discontinued medications.    Signed, Elpidio Galea. Acen Craun, MD 04/21/2013 8:32 AM

## 2013-04-22 ENCOUNTER — Encounter: Payer: Self-pay | Admitting: *Deleted

## 2013-04-22 LAB — LIPID PANEL
Chol/HDL Ratio: 6.6 ratio units — ABNORMAL HIGH (ref 0.0–5.0)
Cholesterol, Total: 256 mg/dL — ABNORMAL HIGH (ref 100–199)
HDL: 39 mg/dL — ABNORMAL LOW (ref >39)
Triglycerides: 131 mg/dL (ref 0–149)

## 2013-09-30 ENCOUNTER — Ambulatory Visit (INDEPENDENT_AMBULATORY_CARE_PROVIDER_SITE_OTHER): Payer: 59 | Admitting: Family Medicine

## 2013-09-30 ENCOUNTER — Encounter: Payer: Self-pay | Admitting: Family Medicine

## 2013-09-30 VITALS — BP 112/80 | HR 80 | Temp 98.0°F | Ht 73.0 in | Wt 247.0 lb

## 2013-09-30 DIAGNOSIS — J111 Influenza due to unidentified influenza virus with other respiratory manifestations: Secondary | ICD-10-CM

## 2013-09-30 MED ORDER — OSELTAMIVIR PHOSPHATE 75 MG PO CAPS: 75.0000 mg | ORAL_CAPSULE | Freq: Two times a day (BID) | ORAL | Status: DC

## 2013-09-30 NOTE — Progress Notes (Signed)
Pre visit review using our clinic review tool, if applicable. No additional management support is needed unless otherwise documented below in the visit note. 

## 2013-09-30 NOTE — Progress Notes (Signed)
Patient Name: Brandon Edwards Date of Birth: 1966/06/12 Medical Record Number: [id number] Gender: male  History of Present Illness:  Brandon Edwards presents with runny nose, sneezing, cough, sore throat, malaise, myalgias, arthralgia, chills, and fever. Oldest son had the flu and dx.   89  Yesterday started fever.   + recent exposure to others with similar symptoms. (wife and son)  The patent denies sore throat as the primary complaint. Denies sthortness of breath/wheezing, otalgia, facial pain, abdominal pain, changes in bowel or bladder.  Generally feels terrible  Tmax: 102  PMH, PHS, Allergies, Problem List, Medications, Family History, and Social History have all been reviewed.  Review of Systems: as above, eating and drinking - tolerating PO. Urinating normally. No excessive vomitting or diarrhea. O/w as above.  Physical Exam:  Filed Vitals:   09/30/13 1207  BP: 112/80  Pulse: 80  Temp: 98 F (36.7 C)  TempSrc: Oral  Height: 6\' 1"  (1.854 m)  Weight: 247 lb (112.038 kg)    Gen: WDWN, NAD; A & O x3, cooperative. Pleasant.Globally Non-toxic HEENT: Normocephalic and atraumatic. Throat clear, w/o exudate, R TM clear, L TM - good landmarks, No fluid present. rhinnorhea. No frontal or maxillary sinus T. MMM NECK: Anterior cervical  LAD is absent CV: RRR, No M/G/R, cap refill <2 sec PULM: Breathing comfortably in no respiratory distress. no wheezing, crackles, rhonchi ABD: S,NT,ND,+BS. No HSM. No rebound. EXT: No c/c/e PSYCH: Friendly, good eye contact MSK: Nml gait  Results for orders placed in visit on 04/21/13  LIPID PANEL      Result Value Ref Range   Cholesterol, Total 256 (*) 100 - 199 mg/dL   Triglycerides 131  0 - 149 mg/dL   HDL 39 (*) >39 mg/dL   VLDL Cholesterol Cal 26  5 - 40 mg/dL   LDL Calculated 191 (*) 0 - 99 mg/dL   Chol/HDL Ratio 6.6 (*) 0.0 - 5.0 ratio units  HEMOGLOBIN A1C      Result Value Ref Range   Hemoglobin A1C 6.4 (*) 4.8 -  5.6 %   Estimated average glucose 137      Assessment and Plan: 1. Influenza: The patient's clinical exam and history is consistent with a diagnosis of influenza.  Placed Brandon Edwards on Tamiflu prophylaxis.  Supportive care. Fluids. Cough medicines as needed  Anti-pyretics.  Infection control emphasized, including OOW or school until AF 24 hours.  Meds ordered this encounter  Medications  . oseltamivir (TAMIFLU) 75 MG capsule    Sig: Take 1 capsule (75 mg total) by mouth 2 (two) times daily.    Dispense:  10 capsule    Refill:  0    Patient Instructions: INFLUENZA Viral illness: High fever, headache, bad cough, body aches, sore throat, sometimes nausea, vomitting, diarrhea.  Usually quick onset  TREATMENT 1. Decongestant: Sudafed (NOT IF HIGH BLOOD PRESSURE) 2. Nose Sprays: Saline nasal spray, Can use Afrin or Neosynephrine, but no more than 3 days 3. Cough Suppressants: DM portion of cough med, or Strong prescription 4. Expectorant: Liquify Secretions (Guaifenesin) 5. Example: Mucinex (expectorant) or Mucinex-D (expectorant and decongestant) 6. Take all prescribed meds 7. Anti-virals may be used if caught early or in high risk people. (Do NOT if pregnant, breast feeding, seizure disorder) 8. Rest helpful, but move some during day 9. Breathe moist air: Humidifier, Vaporizer, or steam from shower 10. No work or school until no fever for 24 hrs WITH NO Tylenol or Ibuprofen. 11. Pneumonia on top of Flu is  possible, if you are doing poorly particularly if a smoker or have COPD, please let us know. 12. Wash hands and cover mouth with cough   Signed,  Olyvia Gopal T. Kip Cropp, MD, [place] [facility] [place] [address] Phone: [phone] Fax: [phone]

## 2014-03-11 ENCOUNTER — Encounter: Payer: Self-pay | Admitting: Family Medicine

## 2014-03-11 ENCOUNTER — Ambulatory Visit (INDEPENDENT_AMBULATORY_CARE_PROVIDER_SITE_OTHER): Payer: 59 | Admitting: Family Medicine

## 2014-03-11 ENCOUNTER — Ambulatory Visit (INDEPENDENT_AMBULATORY_CARE_PROVIDER_SITE_OTHER)
Admission: RE | Admit: 2014-03-11 | Discharge: 2014-03-11 | Disposition: A | Discharge status: Home / Self Care | Payer: 59 | Source: Ambulatory Visit | Attending: Family Medicine | Admitting: Family Medicine

## 2014-03-11 VITALS — BP 120/72 | HR 60 | Temp 98.2°F | Ht 73.0 in | Wt 237.5 lb

## 2014-03-11 DIAGNOSIS — Z0000 Encounter for general adult medical examination without abnormal findings: Secondary | ICD-10-CM

## 2014-03-11 DIAGNOSIS — Z87891 Personal history of nicotine dependence: Secondary | ICD-10-CM

## 2014-03-11 DIAGNOSIS — R059 Cough, unspecified: Secondary | ICD-10-CM

## 2014-03-11 DIAGNOSIS — Z131 Encounter for screening for diabetes mellitus: Secondary | ICD-10-CM

## 2014-03-11 DIAGNOSIS — R05 Cough: Secondary | ICD-10-CM

## 2014-03-11 DIAGNOSIS — E785 Hyperlipidemia, unspecified: Secondary | ICD-10-CM

## 2014-03-11 DIAGNOSIS — Z125 Encounter for screening for malignant neoplasm of prostate: Secondary | ICD-10-CM

## 2014-03-11 NOTE — Progress Notes (Signed)
[place] [address] Phone: [phone] Fax: [phone]  Patient ID: Brandon Edwards MRN: [id number], DOB: 15-Jan-1966, 48 y.o. Date of Encounter: 03/11/2014  Primary Physician:  Owens Loffler, MD   Chief Complaint: Annual Exam   Subjective:   History of Present Illness:  Brandon Edwards is a 48 y.o. pleasant patient who presents with the following:  Preventative Health Maintenance Visit:  Health Maintenance Summary Reviewed and updated, unless pt declines services.  Tobacco History Reviewed. Alcohol: No concerns, no excessive use Exercise Habits: welder, works outside all the time. STD concerns: no risk or activity to increase risk Drug Use: None Encouraged self-testicular check  Health Maintenance  Topic Date Due  . Influenza Vaccine  03/12/2014  . Tetanus/tdap  03/08/2020    Immunization History  Administered Date(s) Administered  . Td 03/08/2010    Patient Active Problem List   Diagnosis Date Noted  . Smoker 10/30/2011  . OSA (obstructive sleep apnea) 12/06/2010  . Hyperlipidemia 11/09/2010  . FATIGUE 03/05/2010   Past Medical History  Diagnosis Date  . BCC (basal cell carcinoma), face   . Hyperlipidemia   . Sleep apnea   . Smoker 10/30/2011   Past Surgical History  Procedure Laterality Date  . Vasectomy      cope   History   Social History  . Marital Status: Married    Spouse Name: N/A    Number of Children: Y  . Years of Education: N/A   Occupational History  . welder     self-employed  .     Social History Main Topics  . Smoking status: Former Smoker -- 1.00 packs/day for 30 years    Types: Cigarettes  . Smokeless tobacco: Never Used     Comment: July 2014  . Alcohol Use: No  . Drug Use: No  . Sexual Activity: Not on file   Other Topics Concern  . Not on file   Social History Narrative  . No narrative on file   Family History  Problem Relation Age of Onset  . Allergies Mother   . Allergies Father    . Allergies Brother   . Allergies Brother   . Skin cancer Paternal Grandfather    No Known Allergies  Medication list has been reviewed and updated.  Review of Systems:  General: Denies fever, chills, sweats. No significant weight loss. Eyes: Denies blurring,significant itching ENT: Denies earache, sore throat, and hoarseness. Cardiovascular: Denies chest pains, palpitations, dyspnea on exertion Respiratory: Denies cough, dyspnea at rest,wheeezing Breast: no concerns about lumps GI: Denies nausea, vomiting, diarrhea, constipation, change in bowel habits, abdominal pain, melena, hematochezia GU: Denies penile discharge, ED, urinary flow / outflow problems. No STD concerns. Musculoskeletal: Denies back pain, joint pain Derm: Denies rash, itching Neuro: Denies  paresthesias, frequent falls, frequent headaches Psych: Denies depression, anxiety Endocrine: Denies cold intolerance, heat intolerance, polydipsia Heme: Denies enlarged lymph nodes Allergy: No hayfever  Objective:   Physical Examination: BP 120/72  Pulse 60  Temp(Src) 98.2 F (36.8 C) (Oral)  Ht 6\' 1"  (1.854 m)  Wt 237 lb 8 oz (107.729 kg)  BMI 31.34 kg/m2 Ideal Body Weight: Weight in (lb) to have BMI = 25: 189.1  No exam data present  GEN: well developed, well nourished, no acute distress Eyes: conjunctiva and lids normal, PERRLA, EOMI ENT: TM clear, nares clear, oral exam WNL Neck: supple, no lymphadenopathy, no thyromegaly, no JVD Pulm: clear to auscultation and percussion, respiratory effort normal CV: regular rate and  rhythm, S1-S2, no murmur, rub or gallop, no bruits, peripheral pulses normal and symmetric, no cyanosis, clubbing, edema or varicosities GI: soft, non-tender; no hepatosplenomegaly, masses; active bowel sounds all quadrants GU: no hernia, testicular mass, penile discharge Lymph: no cervical, axillary or inguinal adenopathy MSK: gait normal, muscle tone and strength WNL, no joint swelling,  effusions, discoloration, crepitus  SKIN: clear, good turgor, color WNL, no rashes, lesions, or ulcerations Neuro: normal mental status, normal strength, sensation, and motion Psych: alert; oriented to person, place and time, normally interactive and not anxious or depressed in appearance.   Assessment & Plan:   Routine general medical examination at a health care facility - Plan: Nicotine/cotinine metabolites, Hepatic function panel  Screening for diabetes mellitus - Plan: Basic metabolic panel, Hemoglobin A1c  Other and unspecified hyperlipidemia - Plan: Lipid panel  Special screening for malignant neoplasm of prostate - Plan: PSA  Ex-smoker - Plan: DG Chest 2 View  Cough - Plan: DG Chest 2 View  Health Maintenance Exam: The patient's preventative maintenance and recommended screening tests for an annual wellness exam were reviewed in full today. Brought up to date unless services declined.  Counselled on the importance of diet, exercise, and its role in overall health and mortality. The patient's FH and SH was reviewed, including their home life, tobacco status, and drug and alcohol status.  Follow-up: No Follow-up on file. Unless noted, follow-up in 1 year for Health Maintenance Exam.  New Prescriptions   No medications on file   Modified Medications   No medications on file   Orders Placed This Encounter  Procedures  . DG Chest 2 View  . Lipid panel  . Basic metabolic panel  . Hemoglobin A1c  . Nicotine/cotinine metabolites  . Hepatic function panel  . PSA    Signed,  Frederico Hamman T. Vondra Aldredge, MD, CAQ Sports Medicine   Discontinued Medications   OSELTAMIVIR (TAMIFLU) 75 MG CAPSULE    Take 1 capsule (75 mg total) by mouth 2 (two) times daily.   Current Medications at Discharge:   Medication List       This list is accurate as of: 03/11/14  2:42 PM.  Always use your most recent med list.               APPLE CIDER VINEGAR PO  Take by mouth daily.      multivitamin tablet  Take 1 tablet by mouth daily.

## 2014-03-11 NOTE — Progress Notes (Signed)
Pre visit review using our clinic review tool, if applicable. No additional management support is needed unless otherwise documented below in the visit note. 

## 2014-03-15 ENCOUNTER — Encounter: Payer: Self-pay | Admitting: *Deleted

## 2014-03-15 LAB — BASIC METABOLIC PANEL
BUN/Creatinine Ratio: 15 (ref 9–20)
BUN: 13 mg/dL (ref 6–24)
CO2: 23 mmol/L (ref 18–29)
Calcium: 9.7 mg/dL (ref 8.7–10.2)
Chloride: 101 mmol/L (ref 97–108)
Creatinine, Ser: 0.86 mg/dL (ref 0.76–1.27)
GFR, EST AFRICAN AMERICAN: 118 mL/min/{1.73_m2} (ref >59)
GFR, EST NON AFRICAN AMERICAN: 102 mL/min/{1.73_m2} (ref >59)
GLUCOSE: 86 mg/dL (ref 65–99)
Potassium: 4.1 mmol/L (ref 3.5–5.2)
Sodium: 138 mmol/L (ref 134–144)

## 2014-03-15 LAB — HEPATIC FUNCTION PANEL
ALT: 24 IU/L (ref 0–44)
AST: 18 IU/L (ref 0–40)
Albumin: 4.6 g/dL (ref 3.5–5.5)
Alkaline Phosphatase: 105 IU/L (ref 39–117)
Bilirubin, Direct: 0.07 mg/dL (ref 0.00–0.40)
TOTAL PROTEIN: 7.1 g/dL (ref 6.0–8.5)
Total Bilirubin: 0.3 mg/dL (ref 0.0–1.2)

## 2014-03-15 LAB — LIPID PANEL
Chol/HDL Ratio: 6.9 ratio units — ABNORMAL HIGH (ref 0.0–5.0)
Cholesterol, Total: 283 mg/dL — ABNORMAL HIGH (ref 100–199)
HDL: 41 mg/dL (ref >39)
LDL Calculated: 206 mg/dL — ABNORMAL HIGH (ref 0–99)
Triglycerides: 181 mg/dL — ABNORMAL HIGH (ref 0–149)
VLDL Cholesterol Cal: 36 mg/dL (ref 5–40)

## 2014-03-15 LAB — PSA: PSA: 0.4 ng/mL (ref 0.0–4.0)

## 2014-03-15 LAB — NICOTINE/COTININE METABOLITES
COTININE: NOT DETECTED ng/mL
Nicotine: NOT DETECTED ng/mL

## 2014-03-15 LAB — HEMOGLOBIN A1C
ESTIMATED AVERAGE GLUCOSE: 123 mg/dL
HEMOGLOBIN A1C: 5.9 % — AB (ref 4.8–5.6)

## 2014-04-06 ENCOUNTER — Telehealth: Payer: Self-pay | Admitting: Family Medicine

## 2014-04-06 NOTE — Telephone Encounter (Signed)
[facility] notified health assessment form was completed and faxed today.

## 2014-04-06 NOTE — Telephone Encounter (Signed)
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

## 2014-08-17 ENCOUNTER — Telehealth: Payer: Self-pay

## 2014-08-17 NOTE — Telephone Encounter (Signed)
PLEASE NOTE: All timestamps contained within this report are represented as [place] Standard Time. CONFIDENTIALTY NOTICE: This fax transmission is intended only for the addressee. It contains information that is legally privileged, confidential or otherwise protected from use or disclosure. If you are not the intended recipient, you are strictly prohibited from reviewing, disclosing, copying using or disseminating any of this information or taking any action in reliance on or regarding this information. If you have received this fax in error, please notify us immediately by telephone so that we can arrange for its return to [place]. Phone: [phone], Toll-Free: [phone], Fax: [phone] Page: 1 of 2 Call Id: [id number] [address] [place] Patient Name: Brandon Edwards Gender: Male DOB: 29-Jan-1966 Age: 49 Y 50 M 8 D Return Phone Number: [phone] (Primary) Address: City/State/Zip: [place] [address] Client [facility] - Client Client Site [place] - Day Physician [place], [place] Contact Type Call Call Type Triage / Clinical Caller Name Suanne Marker Relationship To Patient Spouse Appointment Disposition EMR Appointment Not Necessary Return Phone Number [phone] (Primary) Chief Complaint Flu Symptom Initial Comment Caller states husband may have flu, has chills, low grade fever, diarrhea x 2 days, body aches, very nauseated yesterday. PreDisposition Call Doctor Nurse Assessment Nurse: Myrle Sheng, RN, Larene Beach Date/Time Eilene Ghazi Time): 08/17/2014 3:20:22 PM Confirm and document reason for call. If symptomatic, describe symptoms. ---Caller states he has had diarrhea for 2 days and stomach cramps. He has also had some chills and body aches but no fever. Has the patient traveled out of the country within the last 30 days? ---No Does the patient require triage?  ---Yes Related visit to physician within the last 2 weeks? ---No Does the PT have any chronic conditions? (i.e. diabetes, asthma, etc.) ---No Guidelines Guideline Title Affirmed Question Affirmed Notes Nurse Date/Time Eilene Ghazi Time) Diarrhea Mild diarrhea (all triage questions negative) Myrle Sheng, RN, Larene Beach 08/17/2014 3:21:24 PM Disp. Time Eilene Ghazi Time) Disposition Final User 08/17/2014 3:24:02 [place], RN, Larene Beach Caller Understands: Yes Disagree/Comply: Comply Care Advice Given Per Guideline PLEASE NOTE: All timestamps contained within this report are represented as [place] Standard Time. CONFIDENTIALTY NOTICE: This fax transmission is intended only for the addressee. It contains information that is legally privileged, confidential or otherwise protected from use or disclosure. If you are not the intended recipient, you are strictly prohibited from reviewing, disclosing, copying using or disseminating any of this information or taking any action in reliance on or regarding this information. If you have received this fax in error, please notify us immediately by telephone so that we can arrange for its return to [place]. Phone: [phone], Toll-Free: [phone], Fax: [phone] Page: 2 of 2 Call Id: [id number] Care Advice Given Per Guideline HOME CARE: You should be able to treat this at home. REASSURANCE: * In healthy adults, most new onset diarrhea is caused by a viral infection of the intestines. FLUID THERAPY DURING MILD-MODERATE DIARRHEA: * Drink more fluids, at least 8-10 glasses (8 oz) daily. * For example: sports drinks, diluted fruit juices, soft drinks. * Supplement this with saltine crackers or soups, to make certain that you are getting sufficient fluid and salt to meet your body's needs. * Avoid caffeinated beverages (Reason: caffeine is mildly dehydrating). * Ideal initial foods include boiled starches / cereals (e.g., potatoes, rice, noodles, wheat, oats) with  a small amount of salt to taste. NUTRITION DURING MILD-MODERATE DIARRHEA OTC MEDS -  Loperamide (Imodium AD): * Helps reduce diarrhea. EXPECTED COURSE: Viral diarrhea lasts 4-7 days. Always worse on days 1 and 2. CALL BACK IF: * Signs of dehydration occur (e.g., no urine over 12 hours, very dry mouth, lightheaded, etc.) * Diarrhea persists over 7 days * You become worse. CARE ADVICE given per Diarrhea (Adult) guideline. After Care Instructions Given Call Event Type User Date / Time Description

## 2014-11-01 ENCOUNTER — Encounter (INDEPENDENT_AMBULATORY_CARE_PROVIDER_SITE_OTHER): Payer: Self-pay

## 2014-11-01 ENCOUNTER — Encounter: Payer: Self-pay | Admitting: *Deleted

## 2014-11-01 ENCOUNTER — Ambulatory Visit (INDEPENDENT_AMBULATORY_CARE_PROVIDER_SITE_OTHER): Payer: 59 | Admitting: Pulmonary Disease

## 2014-11-01 VITALS — BP 128/76 | HR 65 | Temp 97.0°F | Ht 73.0 in | Wt 243.0 lb

## 2014-11-01 DIAGNOSIS — F17200 Nicotine dependence, unspecified, uncomplicated: Secondary | ICD-10-CM

## 2014-11-01 DIAGNOSIS — G4733 Obstructive sleep apnea (adult) (pediatric): Secondary | ICD-10-CM

## 2014-11-01 DIAGNOSIS — Z720 Tobacco use: Secondary | ICD-10-CM | POA: Diagnosis not present

## 2014-11-01 NOTE — Progress Notes (Signed)
Subjective:    Patient ID: Brandon Edwards, male    DOB: 1966-06-30, 49 y.o.   MRN: [id number]  HPI The patient is a 49 year old male who who comes in today to reestablish for management of obstructive sleep apnea. He was diagnosed in 1998 with mild to moderate OSA, with an AHI of 16 events per hour. He was started on C Pap, and has been wearing it compliantly since that time. I last saw him in 2012 where he was doing very well with his device, but most recently he has been having issues with breakthrough snoring and nonrestorative sleep. He is wearing his C Pap compliantly, and his mask cushion is approximately 15 months old. He uses a full face mask, and has kept up with changes until recently. His wife states that he snores through the C Pap, and he has increased awakenings at night and nonrestorative sleep. He tells me that he is using his son's C Pap machine that is only about 18 years old, but the pressure was never set to his optimal level. He is having some sleep pressure during the day with periods of inactivity, and his Epworth score today is 7. His weight is up about 15 pounds from his last visit 4 years ago.   Sleep Questionnaire What time do you typically go to bed?( Between what hours) 11p 11p at 1530 on 11/01/14 by Inge Rise, CMA How long does it take you to fall asleep? 1 min 1 min at 1530 on 11/01/14 by Inge Rise, CMA How many times during the night do you wake up? 1 1 at 1530 on 11/01/14 by Inge Rise, CMA What time do you get out of bed to start your day? 0530 0530 at 1530 on 11/01/14 by Inge Rise, CMA Do you drive or operate heavy machinery in your occupation? Yes Yes at 1530 on 11/01/14 by Inge Rise, CMA How much has your weight changed (up or down) over the past two years? (In pounds) 10 lb (4.536 kg) 10 lb (4.536 kg) at 1530 on 11/01/14 by Inge Rise, CMA Have you ever had a sleep study before? Yes Yes at 1530 on 11/01/14 by Inge Rise,  CMA If yes, [facility] at 1530 on 11/01/14 by Inge Rise, CMA If yes, date of study? 1998 1998 at 1530 on 11/01/14 by Inge Rise, CMA Do you currently use CPAP? Yes Yes at 1530 on 11/01/14 by Inge Rise, CMA If so, what pressure? not sure not sure at 1530 on 11/01/14 by Inge Rise, CMA Do you wear oxygen at any time? No No at 1530 on 11/01/14 by Inge Rise, CMA   Review of Systems  Constitutional: Negative for fever, chills, activity change, appetite change and unexpected weight change.  HENT: Negative for congestion, dental problem, ear pain, nosebleeds, postnasal drip, rhinorrhea, sinus pressure, sneezing, sore throat, trouble swallowing and voice change.   Eyes: Negative for redness, itching and visual disturbance.  Respiratory: Negative for cough, choking, chest tightness, shortness of breath and wheezing.   Cardiovascular: Negative for chest pain, palpitations and leg swelling.  Gastrointestinal: Negative for nausea, vomiting and abdominal pain.  Genitourinary: Negative for dysuria and difficulty urinating.  Musculoskeletal: Negative for joint swelling and arthralgias.  Skin: Negative for rash.  Neurological: Negative for headaches.  Hematological: Does not bruise/bleed easily.  Psychiatric/Behavioral: Negative for behavioral problems, confusion and dysphoric mood. The patient is not nervous/anxious.  Objective:   Physical Exam  Constitutional:  Well developed, no acute distress  HENT:  Nares patent without discharge, deviated septum to left with narrowing  Oropharynx without exudate, palate and uvula are elongated.  Eyes:  Perrla, eomi, no scleral icterus  Neck:  No JVD, no TMG  Cardiovascular:  Normal rate, regular rhythm, no rubs or gallops.  No murmurs        Intact distal pulses  Pulmonary :  Normal breath sounds, no stridor or respiratory distress   No rales, rhonchi, or wheezing  Abdominal:   Soft, nondistended, bowel sounds present.  No tenderness noted.   Musculoskeletal:  No lower extremity edema noted.  Lymph Nodes:  No cervical lymphadenopathy noted  Skin:  No cyanosis noted  Neurologic:  Alert, appropriate, moves all 4 extremities without obvious deficit.          Assessment & Plan:

## 2014-11-01 NOTE — Patient Instructions (Signed)
Will send order to your home care company to set your newer cpap machine on auto at 5-20cm.  Will get a download off your machine about 4 weeks after the change.  Please call us if you do not hear about the download. Replace mask cushions every 53mos Work on weight loss followup with me on a yearly basis.

## 2014-11-01 NOTE — Assessment & Plan Note (Signed)
The patient is having breakthrough snoring and nonrestorative sleep despite wearing his C Pap on a consistent basis. Unfortunately, he is using his son's old machine, and never had this device set to his own personal pressure. He is also using a mask that is not significantly aged, but needs a new cushion. Finally, the patient's weight is increased about 15 pounds since the last visit, and therefore his pressure needs may be increased. At this point, I would like to have his home care company set his new her device on the auto setting, and we'll get a download after approximately 4 weeks to make sure we are adequately controlling his events. I've also asked him to keep up with his mask changes and supplies, and to work aggressively on weight loss.

## 2015-01-17 ENCOUNTER — Telehealth: Payer: Self-pay | Admitting: Family Medicine

## 2015-01-17 NOTE — Telephone Encounter (Signed)
Pt has cpx 03/17/15  Needs lab work send to [facility] He needs same labs as last year Please mail to home address

## 2015-01-23 MED ORDER — NONFORMULARY OR COMPOUNDED ITEM: Status: DC

## 2015-01-23 NOTE — Telephone Encounter (Signed)
Lab Orders mailed to patient as requested.

## 2015-01-23 NOTE — Telephone Encounter (Signed)
done

## 2015-03-17 ENCOUNTER — Encounter: Payer: Self-pay | Admitting: Family Medicine

## 2015-03-17 ENCOUNTER — Ambulatory Visit (INDEPENDENT_AMBULATORY_CARE_PROVIDER_SITE_OTHER): Payer: 59 | Admitting: Family Medicine

## 2015-03-17 VITALS — BP 120/76 | HR 63 | Temp 98.4°F | Ht 73.5 in | Wt 238.8 lb

## 2015-03-17 DIAGNOSIS — Z125 Encounter for screening for malignant neoplasm of prostate: Secondary | ICD-10-CM

## 2015-03-17 DIAGNOSIS — Z13220 Encounter for screening for lipoid disorders: Secondary | ICD-10-CM

## 2015-03-17 DIAGNOSIS — R5383 Other fatigue: Secondary | ICD-10-CM

## 2015-03-17 DIAGNOSIS — Z0000 Encounter for general adult medical examination without abnormal findings: Secondary | ICD-10-CM | POA: Diagnosis not present

## 2015-03-17 NOTE — Progress Notes (Signed)
Dr. Frederico Hamman T. Lugene Beougher, MD, [facility] [place], [address] Phone: [phone] Fax: [phone]  03/17/2015  Patient: Brandon Edwards, MRN: [id number], DOB: April 24, 1966, 49 y.o.  Primary Physician:  Owens Loffler, MD  Chief Complaint: Annual Exam  Subjective:   Welton Bord is a 49 y.o. pleasant patient who presents with the following:  Preventative Health Maintenance Visit:  Health Maintenance Summary Reviewed and updated, unless pt declines services.  Tobacco History Reviewed. Alcohol: No concerns, no excessive use Exercise Habits: aeorbic and weight now - a lot STD concerns: no risk or activity to increase risk Drug Use: None Encouraged self-testicular check  Working out a lot, shoulder pain has improved.  Losing   OSA - using sometimes, too.  Will set up colon in future, [facility].   Health Maintenance  Topic Date Due  . HIV Screening  12/09/1979  . COLONOSCOPY  12/09/2014  . INFLUENZA VACCINE  03/13/2015  . TETANUS/TDAP  03/08/2020   Immunization History  Administered Date(s) Administered  . Td 03/08/2010   Patient Active Problem List   Diagnosis Date Noted  . Smoker 10/30/2011  . OSA (obstructive sleep apnea) 12/06/2010  . Hyperlipidemia 11/09/2010  . FATIGUE 03/05/2010   Past Medical History  Diagnosis Date  . BCC (basal cell carcinoma), face   . Hyperlipidemia   . Sleep apnea   . Smoker 10/30/2011   Past Surgical History  Procedure Laterality Date  . Vasectomy      cope   History   Social History  . Marital Status: Married    Spouse Name: N/A  . Number of Children: Y  . Years of Education: N/A   Occupational History  . welder     self-employed  .     Social History Main Topics  . Smoking status: Former Smoker -- 1.00 packs/day for 30 years    Types: Cigarettes    Quit date: 08/12/2014  . Smokeless tobacco: Never Used  . Alcohol Use: No  . Drug Use: No  . Sexual  Activity: Not on file   Other Topics Concern  . Not on file   Social History Narrative   Family History  Problem Relation Age of Onset  . Allergies Mother   . Allergies Father   . Allergies Brother   . Allergies Brother   . Skin cancer Paternal Grandfather    No Known Allergies  Medication list has been reviewed and updated.   General: Denies fever, chills, sweats. No significant weight loss. Eyes: Denies blurring,significant itching ENT: Denies earache, sore throat, and hoarseness. Cardiovascular: Denies chest pains, palpitations, dyspnea on exertion Respiratory: Denies cough, dyspnea at rest,wheeezing Breast: no concerns about lumps GI: Denies nausea, vomiting, diarrhea, constipation, change in bowel habits, abdominal pain, melena, hematochezia GU: Denies penile discharge, ED, urinary flow / outflow problems. No STD concerns. Musculoskeletal: Denies back pain, joint pain Derm: Denies rash, itching Neuro: Denies  paresthesias, frequent falls, frequent headaches Psych: Denies depression, anxiety Endocrine: Denies cold intolerance, heat intolerance, polydipsia Heme: Denies enlarged lymph nodes Allergy: No hayfever  Objective:   BP 120/76 mmHg  Pulse 63  Temp(Src) 98.4 F (36.9 C) (Oral)  Ht 6' 1.5" (1.867 m)  Wt 238 lb 12 oz (108.296 kg)  BMI 31.07 kg/m2 Ideal Body Weight: Weight in (lb) to have BMI = 25: 191.7  No exam data present  GEN: well developed, well nourished, no acute distress Eyes: conjunctiva and lids normal, PERRLA, EOMI  ENT: TM clear, nares clear, oral exam WNL Neck: supple, no lymphadenopathy, no thyromegaly, no JVD Pulm: clear to auscultation and percussion, respiratory effort normal CV: regular rate and rhythm, S1-S2, no murmur, rub or gallop, no bruits, peripheral pulses normal and symmetric, no cyanosis, clubbing, edema or varicosities GI: soft, non-tender; no hepatosplenomegaly, masses; active bowel sounds all quadrants GU: no hernia,  testicular mass, penile discharge Lymph: no cervical, axillary or inguinal adenopathy MSK: gait normal, muscle tone and strength WNL, no joint swelling, effusions, discoloration, crepitus  SKIN: clear, good turgor, color WNL, no rashes, lesions, or ulcerations Neuro: normal mental status, normal strength, sensation, and motion Psych: alert; oriented to person, place and time, normally interactive and not anxious or depressed in appearance. All labs reviewed with patient.  Lipids:    Component Value Date/Time   CHOL 283* 03/11/2014 1434   TRIG 181* 03/11/2014 1434   HDL 41 03/11/2014 1434   LDLDIRECT 203* 08/24/2012 1450   CHOLHDL 6.9* 03/11/2014 1434   CBC: CBC Latest Ref Rng 08/24/2012  WBC 3.4 - 10.8 x10E3/uL 8.3  Hemoglobin 12.6 - 17.7 g/dL 15.5  Hematocrit 37.5 - 51.0 % 33.3    Basic Metabolic Panel:    Component Value Date/Time   NA 138 03/11/2014 1434   K 4.1 03/11/2014 1434   CL 101 03/11/2014 1434   CO2 23 03/11/2014 1434   BUN 13 03/11/2014 1434   CREATININE 0.86 03/11/2014 1434   GLUCOSE 86 03/11/2014 1434   CALCIUM 9.7 03/11/2014 1434   Hepatic Function Latest Ref Rng 03/11/2014 08/24/2012  Total Protein 6.0 - 8.5 g/dL 7.1 7.1  AST 0 - 40 IU/L 18 17  ALT 0 - 44 IU/L 24 27  Alk Phosphatase 39 - 117 IU/L 105 111  Total Bilirubin 0.0 - 1.2 mg/dL 0.3 0.3  Bilirubin, Direct 0.00 - 0.40 mg/dL 0.07 0.07    No results found for: TSH Lab Results  Component Value Date   PSA 0.4 03/11/2014   PSA 0.4 08/24/2012    Assessment and Plan:   Routine general medical examination at a health care facility - Plan: Hemoglobin A1c, Nicotine/cotinine metabolites  Screening for prostate cancer - Plan: CBC with Differential/Platelet, Hepatic function panel, PSA  Other fatigue - Plan: Basic metabolic panel, Hemoglobin A1c  Screening, lipid - Plan: Lipid panel  Health Maintenance Exam: The patient's preventative maintenance and recommended screening tests for an annual  wellness exam were reviewed in full today. Brought up to date unless services declined.  Counselled on the importance of diet, exercise, and its role in overall health and mortality. The patient's FH and SH was reviewed, including their home life, tobacco status, and drug and alcohol status.  Follow-up: Return in 1 year (on 03/16/2016). Unless noted, follow-up in 1 year for Health Maintenance Exam.  New Prescriptions   No medications on file   Orders Placed This Encounter  Procedures  . Lipid panel  . Basic metabolic panel  . Hemoglobin A1c  . Nicotine/cotinine metabolites  . CBC with Differential/Platelet  . Hepatic function panel  . PSA    Signed,  Frederico Hamman T. Keri Veale, MD   Patient's Medications  New Prescriptions   No medications on file  Previous Medications   APPLE CIDER VINEGAR PO    Take by mouth daily.   MULTIPLE VITAMIN (MULTIVITAMIN) TABLET    Take 1 tablet by mouth daily.  Modified Medications   No medications on file  Discontinued Medications   NONFORMULARY OR COMPOUNDED ITEM  Epic Account: [id number] Lab Studies: BMP, CBC with diff, HFP: Z79.899 FLP: E78.5 PSA: Z12.5 Nicotine/Cotinine Metabolites: v15.82 Hemoglobin A1c: R73.9

## 2015-03-17 NOTE — Progress Notes (Signed)
Pre visit review using our clinic review tool, if applicable. No additional management support is needed unless otherwise documented below in the visit note. 

## 2015-03-21 LAB — LIPID PANEL
Chol/HDL Ratio: 6 ratio units — ABNORMAL HIGH (ref 0.0–5.0)
Cholesterol, Total: 239 mg/dL — ABNORMAL HIGH (ref 100–199)
HDL: 40 mg/dL (ref >39)
LDL Calculated: 178 mg/dL — ABNORMAL HIGH (ref 0–99)
Triglycerides: 105 mg/dL (ref 0–149)
VLDL Cholesterol Cal: 21 mg/dL (ref 5–40)

## 2015-03-21 LAB — CBC WITH DIFFERENTIAL/PLATELET
Basophils Absolute: 0.1 10*3/uL (ref 0.0–0.2)
Basos: 2 %
EOS (ABSOLUTE): 0.3 10*3/uL (ref 0.0–0.4)
EOS: 5 %
HEMOGLOBIN: 15.1 g/dL (ref 12.6–17.7)
Hematocrit: 44.4 % (ref 37.5–51.0)
Immature Grans (Abs): 0 10*3/uL (ref 0.0–0.1)
Immature Granulocytes: 0 %
LYMPHS ABS: 2.4 10*3/uL (ref 0.7–3.1)
Lymphs: 36 %
MCH: 29.6 pg (ref 26.6–33.0)
MCHC: 34 g/dL (ref 31.5–35.7)
MCV: 87 fL (ref 79–97)
Monocytes Absolute: 0.6 10*3/uL (ref 0.1–0.9)
Monocytes: 8 %
NEUTROS ABS: 3.2 10*3/uL (ref 1.4–7.0)
Neutrophils: 49 %
Platelets: 144 10*3/uL — ABNORMAL LOW (ref 150–379)
RBC: 5.1 x10E6/uL (ref 4.14–5.80)
RDW: 13.6 % (ref 12.3–15.4)
WBC: 6.6 10*3/uL (ref 3.4–10.8)

## 2015-03-21 LAB — BASIC METABOLIC PANEL
BUN/Creatinine Ratio: 13 (ref 9–20)
BUN: 11 mg/dL (ref 6–24)
CHLORIDE: 103 mmol/L (ref 97–108)
CO2: 25 mmol/L (ref 18–29)
CREATININE: 0.82 mg/dL (ref 0.76–1.27)
Calcium: 8.2 mg/dL — ABNORMAL LOW (ref 8.7–10.2)
GFR calc Af Amer: 119 mL/min/{1.73_m2} (ref >59)
GFR, EST NON AFRICAN AMERICAN: 103 mL/min/{1.73_m2} (ref >59)
Glucose: 100 mg/dL — ABNORMAL HIGH (ref 65–99)
POTASSIUM: 4.5 mmol/L (ref 3.5–5.2)
Sodium: 142 mmol/L (ref 134–144)

## 2015-03-21 LAB — NICOTINE/COTININE METABOLITES
COTININE: NOT DETECTED ng/mL
Nicotine: NOT DETECTED ng/mL

## 2015-03-21 LAB — HEPATIC FUNCTION PANEL
ALBUMIN: 4.2 g/dL (ref 3.5–5.5)
ALT: 27 IU/L (ref 0–44)
AST: 14 IU/L (ref 0–40)
Alkaline Phosphatase: 112 IU/L (ref 39–117)
Bilirubin Total: 0.4 mg/dL (ref 0.0–1.2)
Bilirubin, Direct: 0.1 mg/dL (ref 0.00–0.40)
Total Protein: 6.7 g/dL (ref 6.0–8.5)

## 2015-03-21 LAB — HEMOGLOBIN A1C
Est. average glucose Bld gHb Est-mCnc: 123 mg/dL
Hgb A1c MFr Bld: 5.9 % — ABNORMAL HIGH (ref 4.8–5.6)

## 2015-03-21 LAB — PSA: Prostate Specific Ag, Serum: 0.5 ng/mL (ref 0.0–4.0)

## 2015-03-23 ENCOUNTER — Encounter: Payer: Self-pay | Admitting: *Deleted

## 2015-03-29 ENCOUNTER — Telehealth: Payer: Self-pay | Admitting: Family Medicine

## 2015-03-29 NOTE — Telephone Encounter (Signed)
Mr. Yeates notified that I would be in on Friday at 8:00 am.

## 2015-03-29 NOTE — Telephone Encounter (Signed)
Pt is coming in Friday morning to weigh in and wants to make sure that you will be in and available. Call back number is [phone]

## 2015-04-10 ENCOUNTER — Telehealth: Payer: Self-pay | Admitting: Family Medicine

## 2015-04-10 NOTE — Telephone Encounter (Signed)
Spoke with Brandon Edwards.  He ask that we use his last weight that we checked on 03/31/2015 which was 233 lbs.  He also ask that I go ahead and fax the forms in for him.  Advised he would need to come by office and sign the biometric screening form before I could fax it in.  He will stop by office this afternoon to sign.

## 2015-04-10 NOTE — Telephone Encounter (Signed)
Patient called and said he had spoke to Butch Penny about his Biometric Screening and his insurance.  He's asking for Butch Penny to call him back.

## 2015-04-12 NOTE — Telephone Encounter (Signed)
Mr. Schoffstall notified Biometric Forms with lab results were faxed on 04/10/2015.

## 2015-04-12 NOTE — Telephone Encounter (Signed)
Pt left v/m; today is last day for biometric screening paperwork to be faxed in. Pt wants cb to know if form has already been faxed.Please advise.

## 2015-05-18 ENCOUNTER — Telehealth: Payer: Self-pay | Admitting: Family Medicine

## 2015-05-18 NOTE — Telephone Encounter (Signed)
Noted  

## 2015-05-18 NOTE — Telephone Encounter (Signed)
Spoke with Mr. Brandon Edwards.  He states he was flagged on his health assessment for his BMI.  Needs Appeal form filled out and signed by Dr. Lorelei Pont.  Mr. Brandon Edwards will drop appeals form by office for [place] to complete.

## 2015-05-18 NOTE — Telephone Encounter (Signed)
Pt called - requesting cb regarding health screenings. Please call back [phone] thank you

## 2015-05-29 NOTE — Telephone Encounter (Signed)
done

## 2015-05-29 NOTE — Telephone Encounter (Signed)
BMI appeal form placed in Dr. Lillie Fragmin in box to complete.

## 2015-05-29 NOTE — Telephone Encounter (Signed)
Pt dropped of appeal form to be completed.   Form in Dr. Lillie Fragmin RX box.   Thank you

## 2015-05-30 NOTE — Telephone Encounter (Signed)
Mr. Balderston notified BMI appeal form is ready to be picked up at the front desk.

## 2015-07-27 ENCOUNTER — Telehealth: Payer: Self-pay

## 2015-07-27 NOTE — Telephone Encounter (Signed)
Unable to leave message for patient regarding flu shot. Voicemail box was full.

## 2015-08-21 ENCOUNTER — Telehealth: Payer: Self-pay | Admitting: *Deleted

## 2015-08-21 NOTE — Telephone Encounter (Signed)
Left message for patient to return call.

## 2015-08-21 NOTE — Telephone Encounter (Signed)
PLEASE NOTE: All timestamps contained within this report are represented as [place] Standard Time. CONFIDENTIALTY NOTICE: This fax transmission is intended only for the addressee. It contains information that is legally privileged, confidential or otherwise protected from use or disclosure. If you are not the intended recipient, you are strictly prohibited from reviewing, disclosing, copying using or disseminating any of this information or taking any action in reliance on or regarding this information. If you have received this fax in error, please notify us immediately by telephone so that we can arrange for its return to [place]. Phone: [phone], Toll-Free: [phone], Fax: [phone] Page: 1 of 2 Call Id: [id number] [place] Patient Name: Brandon Edwards Gender: Male DOB: 1965-10-24 Age: 50 Y 74 M 9 D Return Phone Number: [phone] (Primary) Address: City/State/Zip: [place] [address] Client [facility] - Client Client Site [place] Physician [place], [place] Type Call Call Type Triage / Clinical Relationship To Patient Self Return Phone Number [phone] (Primary) Chief Complaint Jaundice Initial Comment caller states that he has been having flu symptoms and may have jaundice symptoms [facility] in [place] (patient states this is closest facility to him and roads are bad). PreDisposition Go to ED Nurse Assessment Nurse: Vevelyn Royals, RN, Verdis Frederickson Date/Time Eilene Ghazi Time): 08/19/2015 1:05:19 PM Confirm and document reason for call. If symptomatic, describe symptoms. ---Caller states that he has been having flu symptoms and may have jaundice symptoms. Symptoms over 1 1/2 weeks. Started with sinus ha. Took Z-pack. Eyes are light yellow and urine very dark, almost brown. Denies current  fever, but usually has at night. Has the patient traveled out of the country within the last 30 days? ---No Does the patient have any new or worsening symptoms? ---Yes Will a triage be completed? ---Yes Related visit to physician within the last 2 weeks? ---No Does the PT have any chronic conditions? (i.e. diabetes, asthma, etc.) ---Yes List chronic conditions. ---high cholesterol Is this a behavioral health or substance abuse call? ---No Guidelines Guideline Title Affirmed Question Affirmed Notes Nurse Date/Time (Eastern Time) Jaundice [1] Drinking very little AND [2] dehydration suspected (e.g., no urine > 12 hours, very dry mouth, very lightheaded) Vevelyn Royals, [place], Verdis Frederickson 08/19/2015 1:10:33 PM Disp. Time Eilene Ghazi Time) Disposition Final User 08/19/2015 1:15:16 PM Go to ED Now (or PCP triage) Yes Vevelyn Royals, RN, Feliz Beam NOTE: All timestamps contained within this report are represented as [place] Standard Time. CONFIDENTIALTY NOTICE: This fax transmission is intended only for the addressee. It contains information that is legally privileged, confidential or otherwise protected from use or disclosure. If you are not the intended recipient, you are strictly prohibited from reviewing, disclosing, copying using or disseminating any of this information or taking any action in reliance on or regarding this information. If you have received this fax in error, please notify us immediately by telephone so that we can arrange for its return to [place]. Phone: [phone], Toll-Free: [phone], Fax: [phone] Page: 2 of 2 Call Id: [id number] Caller Understands: Yes Disagree/Comply: Comply Care Advice Given Per Guideline GO TO ED NOW (OR PCP TRIAGE): * IF NO PCP TRIAGE: You need to be seen. [facility] to the [facility] at [facility] within the next hour. Leave as soon as you can. DRIVING: Another adult should drive. BRING MEDICINES: * Please bring a list of your current medicines when you go to see  the  doctor. * It is also a good idea to bring the pill bottles too. This will help the doctor to make certain you are taking the right medicines and the right dose. CARE ADVICE given per Jaundice (Adult) guideline. After Care Instructions Given Call Event Type User Date / Time Description Comments User: Noah Delaine, RN Date/Time Eilene Ghazi Time): 08/19/2015 1:13:29 PM Patient states he is urinating, and urine is very dark. Fevers on and off. Shaking chills earlier in the week. Mouth feels very dry. Thinks he may be dehydrated. Referrals GO TO FACILITY OTHER - SPECIFY

## 2015-09-01 ENCOUNTER — Ambulatory Visit (INDEPENDENT_AMBULATORY_CARE_PROVIDER_SITE_OTHER): Payer: 59 | Admitting: Primary Care

## 2015-09-01 ENCOUNTER — Encounter: Payer: Self-pay | Admitting: Primary Care

## 2015-09-01 VITALS — BP 140/92 | HR 87 | Temp 98.2°F

## 2015-09-01 DIAGNOSIS — R61 Generalized hyperhidrosis: Secondary | ICD-10-CM | POA: Diagnosis not present

## 2015-09-01 DIAGNOSIS — R05 Cough: Secondary | ICD-10-CM

## 2015-09-01 DIAGNOSIS — R059 Cough, unspecified: Secondary | ICD-10-CM

## 2015-09-01 NOTE — Progress Notes (Signed)
Pre visit review using our clinic review tool, if applicable. No additional management support is needed unless otherwise documented below in the visit note. 

## 2015-09-01 NOTE — Progress Notes (Signed)
Subjective:    Patient ID: Brandon Edwards, male    DOB: 1965/11/11, 50 y.o.   MRN: [id number]  HPI  Brandon Edwards is a 50 year old male who presents today with a chief complaint of fever, night sweats. 2 weeks ago he developed low grade fever with "yellow" discoloration to skin. He presented to [facility] ED and was told he had elevated liver enzymes. They completed chest xray in [facility] and endorses normal results.   He wasn't feeling any improved so he presented to [facility] ED several days later and was admitted for 2 days for suspected acute Hepatitis B. Since then he's had low grade fever, night sweats, shortness of breath, mild cough, fatigue. He reports hemoptysis 1 week ago, but otherwise yellow mucous.   Denies no known exposure of TB. He endorses a weight loss of 8 pounds but has little appetite and was not allowed to eat at [facility]. He's not taken anything OTC. Overall he's feeling improved everyday. He presents today as he is concerned he may have TB and would like testing.    Review of Systems  Constitutional: Positive for fever, chills, diaphoresis, appetite change and fatigue.  HENT: Negative for congestion, sinus pressure and sore throat.   Respiratory: Positive for cough and shortness of breath.   Cardiovascular: Negative for chest pain.  Gastrointestinal: Negative for nausea, vomiting, abdominal pain, diarrhea and blood in stool.  Musculoskeletal: Negative for myalgias.  Skin: Negative for color change.  Neurological: Negative for weakness.       Past Medical History  Diagnosis Date  . BCC (basal cell carcinoma), face   . Hyperlipidemia   . Sleep apnea   . Smoker 10/30/2011    Social History   Social History  . Marital Status: Married    Spouse Name: N/A  . Number of Children: Y  . Years of Education: N/A   Occupational History  . welder     self-employed  .     Social History Main Topics  . Smoking status: Former Smoker -- 1.00 packs/day  for 30 years    Types: Cigarettes    Quit date: 08/12/2014  . Smokeless tobacco: Never Used  . Alcohol Use: No  . Drug Use: No  . Sexual Activity: Not on file   Other Topics Concern  . Not on file   Social History Narrative    Past Surgical History  Procedure Laterality Date  . Vasectomy      cope    Family History  Problem Relation Age of Onset  . Allergies Mother   . Allergies Father   . Allergies Brother   . Allergies Brother   . Skin cancer Paternal Grandfather     No Known Allergies  Current Outpatient Prescriptions on File Prior to Visit  Medication Sig Dispense Refill  . APPLE CIDER VINEGAR PO Take by mouth daily.    . Multiple Vitamin (MULTIVITAMIN) tablet Take 1 tablet by mouth daily.     No current facility-administered medications on file prior to visit.    BP 140/92 mmHg  Pulse 87  Temp(Src) 98.2 F (36.8 C) (Oral)  SpO2 96%    Objective:   Physical Exam  Constitutional: He appears well-nourished.  HENT:  Right Ear: Tympanic membrane and ear canal normal.  Left Ear: Tympanic membrane and ear canal normal.  Nose: Nose normal. Right sinus exhibits no maxillary sinus tenderness and no frontal sinus tenderness. Left sinus exhibits no maxillary sinus tenderness and  no frontal sinus tenderness.  Mouth/Throat: Posterior oropharyngeal erythema present. No oropharyngeal exudate or posterior oropharyngeal edema.  Neck: Neck supple.  Cardiovascular: Normal rate and regular rhythm.   Pulmonary/Chest: Effort normal and breath sounds normal. He has no wheezes. He has no rales.  Abdominal: Soft. Bowel sounds are normal. There is no tenderness.  Lymphadenopathy:    He has no cervical adenopathy.  Skin: Skin is warm and dry.  No evidence of janudice.          Assessment & Plan:  TB testing:  Diagnosed with acute hepatitis B at [place] earlier this week. Continues to have some night sweats, fatigue, mild cough, overall feeling improved. No  known TB exposure. No hemoptysis. Endorses normal chest xray at [facility] ED. Exam mostly unremarkable today. Lungs clear, abdomen non tender. Does not appear toxic or sickly. No cough during exam. All labs and imaging from [facility] reviewed today. He has follow up with them Monday next week. TB quantiferon test ordered today, although suspicion is low. Symptoms likely due to acute hepatitis. Will have him follow up with [facility] as scheduled. Return precautions provided.   Patient and staff masked appropriately.

## 2015-09-01 NOTE — Patient Instructions (Addendum)
Complete lab work prior to leaving today. I will notify you of your results once received.   Follow up with [facility] as scheduled Monday.  Do not take any tylenol as we do not want to cause any damage to your liver.  Ensure you stay hydrated with water and advance diet as tolerate.  We will be in touch soon.  It was a pleasure meeting you!

## 2015-09-04 ENCOUNTER — Telehealth: Payer: Self-pay | Admitting: Family Medicine

## 2015-09-04 NOTE — Telephone Encounter (Signed)
Informed Vaughan Basta and she notified patient that we did not get the results back yet. Will call when we received the results.

## 2015-09-04 NOTE — Telephone Encounter (Signed)
Pt called re: lab result from 09/01/15.  Informed pt that lab results not received yet.  Best number to call pt when labs received [phone]

## 2015-09-05 ENCOUNTER — Encounter: Payer: Self-pay | Admitting: Primary Care

## 2015-09-05 LAB — QUANTIFERON IN TUBE
QFT TB AG MINUS NIL VALUE: 0.02 IU/mL
QUANTIFERON MITOGEN VALUE: 9.31 IU/mL
QUANTIFERON TB AG VALUE: 0.04 IU/mL
QUANTIFERON TB GOLD: NEGATIVE
Quantiferon Nil Value: 0.02 IU/mL

## 2015-09-05 LAB — QUANTIFERON TB GOLD ASSAY (BLOOD)

## 2015-09-06 ENCOUNTER — Other Ambulatory Visit: Payer: Self-pay | Admitting: Primary Care

## 2015-09-06 DIAGNOSIS — R5383 Other fatigue: Secondary | ICD-10-CM

## 2015-09-06 NOTE — Telephone Encounter (Signed)
Mrs. Carlis Abbott saw the patient a few days ago - I will send to her. I know him well. Happy to discuss if wanted.

## 2015-09-07 ENCOUNTER — Other Ambulatory Visit (INDEPENDENT_AMBULATORY_CARE_PROVIDER_SITE_OTHER): Payer: 59

## 2015-09-07 DIAGNOSIS — R5383 Other fatigue: Secondary | ICD-10-CM

## 2015-09-08 LAB — B. BURGDORFI ANTIBODIES: Lyme IgG/IgM Ab: <0.91 {ISR} (ref 0.00–0.90)

## 2015-09-12 NOTE — Telephone Encounter (Signed)
Pt was seen [facility] on 09/01/15 and in that office note pt had previously been seen at [facility] ED.

## 2015-10-30 ENCOUNTER — Encounter: Payer: Self-pay | Admitting: Family Medicine

## 2015-10-30 ENCOUNTER — Ambulatory Visit (INDEPENDENT_AMBULATORY_CARE_PROVIDER_SITE_OTHER): Payer: 59 | Admitting: Family Medicine

## 2015-10-30 VITALS — BP 140/86 | HR 76 | Temp 97.6°F | Ht 73.5 in | Wt 239.0 lb

## 2015-10-30 DIAGNOSIS — B179 Acute viral hepatitis, unspecified: Secondary | ICD-10-CM

## 2015-10-30 DIAGNOSIS — K7200 Acute and subacute hepatic failure without coma: Secondary | ICD-10-CM | POA: Diagnosis not present

## 2015-10-30 DIAGNOSIS — R740 Nonspecific elevation of levels of transaminase and lactic acid dehydrogenase [LDH]: Secondary | ICD-10-CM | POA: Diagnosis not present

## 2015-10-30 DIAGNOSIS — R7401 Elevation of levels of liver transaminase levels: Secondary | ICD-10-CM

## 2015-10-30 NOTE — Progress Notes (Signed)
Dr. Frederico Hamman T. Raaga Maeder, MD, [facility] and Sports Medicine [place], [address] Phone: [phone] Fax: [phone]  10/30/2015  Patient: Brandon Edwards, MRN: [id number], DOB: [birth year], 50 y.o.  Primary Physician:  Owens Loffler, MD   Chief Complaint  Patient presents with  . Follow-up    Elevated Liver Function from [facility] hospital   Subjective:   Brandon Edwards is a 50 y.o. very pleasant male patient who presents with the following:  Patient is following up from an elevated transaminitis where he was hospitalized and [facility]. He had an extensive workup which was reviewed in the office today and with the patient including CTs and MRIs of his abdomen as well as multiple serological and blood studies. No conclusive cause was determined, but the patient has had his liver function studies returned to normal.  Right now, he feels perfectly normal, but he did want to follow-up with me and make sure that his liver function has remained normal.  Past Medical History, Surgical History, Social History, Family History, Problem List, Medications, and Allergies have been reviewed and updated if relevant.  Patient Active Problem List   Diagnosis Date Noted  . Smoker 10/30/2011  . OSA (obstructive sleep apnea) 12/06/2010  . Hyperlipidemia 11/09/2010  . FATIGUE 03/05/2010    Past Medical History  Diagnosis Date  . BCC (basal cell carcinoma), face   . Hyperlipidemia   . Sleep apnea   . Smoker 10/30/2011    Past Surgical History  Procedure Laterality Date  . Vasectomy      cope    Social History   Social History  . Marital Status: Married    Spouse Name: N/A  . Number of Children: Y  . Years of Education: N/A   Occupational History  . welder     self-employed  .     Social History Main Topics  . Smoking status: Current Every Day Smoker -- 1.00 packs/day for 30 years    Types: Cigarettes    Last Attempt to Quit:  08/12/2014  . Smokeless tobacco: Never Used  . Alcohol Use: No  . Drug Use: No  . Sexual Activity: Not on file   Other Topics Concern  . Not on file   Social History Narrative    Family History  Problem Relation Age of Onset  . Allergies Mother   . Allergies Father   . Allergies Brother   . Allergies Brother   . Skin cancer Paternal Grandfather     No Known Allergies  Medication list reviewed and updated in full in [place].   GEN: No acute illnesses, no fevers, chills. GI: No n/v/d, eating normally Pulm: No SOB Interactive and getting along well at home.  Otherwise, ROS is as per the HPI.  Objective:   BP 140/86 mmHg  Pulse 76  Temp(Src) 97.6 F (36.4 C) (Oral)  Ht 6' 1.5" (1.867 m)  Wt 239 lb (108.41 kg)  BMI 31.10 kg/m2  GEN: WDWN, NAD, Non-toxic, A & O x 3 HEENT: Atraumatic, Normocephalic. Neck supple. No masses, No LAD. Ears and Nose: No external deformity. CV: RRR, No M/G/R. No JVD. No thrill. No extra heart sounds. PULM: CTA B, no wheezes, crackles, rhonchi. No retractions. No resp. distress. No accessory muscle use. EXTR: No c/c/e NEURO Normal gait.  PSYCH: Normally interactive. Conversant. Not depressed or anxious appearing.  Calm demeanor.   Laboratory and Imaging Data:  Assessment and Plan:   Acute  hepatitis - Plan: Basic metabolic panel, CBC with Differential/Platelet, Hepatic function panel, CANCELED: CBC with Differential/Platelet, CANCELED: Basic metabolic panel, CANCELED: Hepatic function panel  Elevated transaminase level - Plan: Basic metabolic panel, CBC with Differential/Platelet, Hepatic function panel, CANCELED: CBC with Differential/Platelet, CANCELED: Basic metabolic panel, CANCELED: Hepatic function panel  Independently reviewed and reviewed in the office with the patient.  [facility] notes.  Reviewed that the patient did have a mildly elevated ANA, and a mildly elevated alpha-1 anti-trypsin.   We will ensure the patient's  hepatic function has returned normal, and if not he will need to follow-up with the liver specialists.  Follow-up: No Follow-up on file.  Orders Placed This Encounter  Procedures  . Basic metabolic panel  . CBC with Differential/Platelet  . Hepatic function panel    Signed,  Frederico Hamman T. Sankalp Ferrell, MD   Patient's Medications  New Prescriptions   No medications on file  Previous Medications   APPLE CIDER VINEGAR PO    Take by mouth daily.   GRAPE SEED 50 MG CAPS    Take 1 capsule by mouth daily.   MULTIPLE VITAMIN (MULTIVITAMIN) TABLET    Take 1 tablet by mouth daily.  Modified Medications   No medications on file  Discontinued Medications   No medications on file

## 2015-10-30 NOTE — Progress Notes (Signed)
Pre visit review using our clinic review tool, if applicable. No additional management support is needed unless otherwise documented below in the visit note. 

## 2015-10-31 LAB — HEPATIC FUNCTION PANEL
ALBUMIN: 4.4 g/dL (ref 3.5–5.5)
ALK PHOS: 113 IU/L (ref 39–117)
ALT: 30 IU/L (ref 0–44)
AST: 23 IU/L (ref 0–40)
BILIRUBIN, DIRECT: 0.09 mg/dL (ref 0.00–0.40)
Bilirubin Total: 0.3 mg/dL (ref 0.0–1.2)
Total Protein: 7.1 g/dL (ref 6.0–8.5)

## 2015-10-31 LAB — BASIC METABOLIC PANEL
BUN / CREAT RATIO: 17 (ref 9–20)
BUN: 13 mg/dL (ref 6–24)
CALCIUM: 9.1 mg/dL (ref 8.7–10.2)
CO2: 24 mmol/L (ref 18–29)
CREATININE: 0.77 mg/dL (ref 0.76–1.27)
Chloride: 100 mmol/L (ref 96–106)
GFR, EST AFRICAN AMERICAN: 122 mL/min/{1.73_m2} (ref >59)
GFR, EST NON AFRICAN AMERICAN: 106 mL/min/{1.73_m2} (ref >59)
GLUCOSE: 97 mg/dL (ref 65–99)
Potassium: 4.4 mmol/L (ref 3.5–5.2)
SODIUM: 139 mmol/L (ref 134–144)

## 2015-10-31 LAB — CBC WITH DIFFERENTIAL/PLATELET
BASOS: 2 %
Basophils Absolute: 0.1 10*3/uL (ref 0.0–0.2)
EOS (ABSOLUTE): 0.1 10*3/uL (ref 0.0–0.4)
EOS: 1 %
HEMATOCRIT: 46.1 % (ref 37.5–51.0)
HEMOGLOBIN: 16.1 g/dL (ref 12.6–17.7)
Immature Grans (Abs): 0 10*3/uL (ref 0.0–0.1)
Immature Granulocytes: 0 %
LYMPHS ABS: 2.7 10*3/uL (ref 0.7–3.1)
Lymphs: 48 %
MCH: 29 pg (ref 26.6–33.0)
MCHC: 34.9 g/dL (ref 31.5–35.7)
MCV: 83 fL (ref 79–97)
MONOS ABS: 0.5 10*3/uL (ref 0.1–0.9)
Monocytes: 8 %
Neutrophils Absolute: 2.3 10*3/uL (ref 1.4–7.0)
Neutrophils: 41 %
Platelets: 117 10*3/uL — ABNORMAL LOW (ref 150–379)
RBC: 5.55 x10E6/uL (ref 4.14–5.80)
RDW: 13.6 % (ref 12.3–15.4)
WBC: 5.6 10*3/uL (ref 3.4–10.8)

## 2015-12-18 ENCOUNTER — Telehealth: Payer: Self-pay

## 2015-12-18 MED ORDER — TADALAFIL 20 MG PO TABS: 10.0000 mg | ORAL_TABLET | ORAL | Status: DC | PRN

## 2015-12-18 NOTE — Telephone Encounter (Signed)
Pt was seen [facility] on 03/17/15 and pt discussed with Dr Lorelei Pont about possible Cialis rx. Pt has decided he wants Cialis sent to [place]. Pt request cb when med sent to pharmacy.

## 2015-12-18 NOTE — Telephone Encounter (Signed)
cialis 20 mg, 1/2 to 1 po prior to intercourse. #10, 11 ref

## 2016-03-01 ENCOUNTER — Encounter: Payer: Self-pay | Admitting: Family Medicine

## 2016-03-01 ENCOUNTER — Ambulatory Visit (INDEPENDENT_AMBULATORY_CARE_PROVIDER_SITE_OTHER): Payer: 59 | Admitting: Family Medicine

## 2016-03-01 VITALS — BP 120/72 | HR 76 | Temp 98.6°F | Ht 73.0 in | Wt 235.2 lb

## 2016-03-01 DIAGNOSIS — Z131 Encounter for screening for diabetes mellitus: Secondary | ICD-10-CM

## 2016-03-01 DIAGNOSIS — Z1211 Encounter for screening for malignant neoplasm of colon: Secondary | ICD-10-CM | POA: Diagnosis not present

## 2016-03-01 DIAGNOSIS — Z125 Encounter for screening for malignant neoplasm of prostate: Secondary | ICD-10-CM

## 2016-03-01 DIAGNOSIS — E785 Hyperlipidemia, unspecified: Secondary | ICD-10-CM

## 2016-03-01 DIAGNOSIS — Z0000 Encounter for general adult medical examination without abnormal findings: Secondary | ICD-10-CM | POA: Diagnosis not present

## 2016-03-01 DIAGNOSIS — Z79899 Other long term (current) drug therapy: Secondary | ICD-10-CM

## 2016-03-01 NOTE — Progress Notes (Signed)
Pre visit review using our clinic review tool, if applicable. No additional management support is needed unless otherwise documented below in the visit note. 

## 2016-03-01 NOTE — Patient Instructions (Signed)
YOUTUBE:  Scapular stabilization  Specific exercises:  [place] Lawnmower   Advice about weightlifting with SHOULDER PROBLEMS:  1. Avoid overhead presses, military presses, clean and jerk, snatch, heavy incline bench presses, heavy bench presses, any kind of chest fly. Heavy lateral raises will hurt.  ALL OF THESE MAKE THE ROTATOR CUFF IMPINGE ON THE BONY ANATOMY ABOVE THEM AND OFTEN CAUSE RECURRENT BURSITIS AND ROTATOR CUFF TENDINITIS.  2. If you have to do bench presses then I would do the decline press and not lock out at the end of the lift. DUMBBELLS WILL TAKE STRESS OFF THE SHOULDER, SO I RECOMMEND SWITCHING FROM BAR PRESSES TO DUMBBELLS.  Continue with rotator cuff strengthening and scapular stabilizaton. Recommended that you could do all aerobic actvity.  3. All pulls, lat pull downs, pull-ups, mid-back low back, arms should be no problem. 4. Core is fine

## 2016-03-01 NOTE — Progress Notes (Signed)
Dr. Frederico Hamman T. Soraya Paquette, MD, [facility] Primary Care and Sports Medicine [place], [address] Phone: [phone] Fax: [phone]  03/01/2016  Patient: Brandon Edwards, MRN: [id number], DOB: [birth year], 50 y.o.  Primary Physician:  Owens Loffler, MD   Chief Complaint  Patient presents with  . Annual Exam   Subjective:   Brandon Edwards is a 50 y.o. pleasant patient who presents with the following:  Preventative Health Maintenance Visit:  Health Maintenance Summary Reviewed and updated, unless pt declines services.  Tobacco History Reviewed. Alcohol: No concerns, no excessive use Exercise Habits: working out 6-7 days a week now, about 1 1/2 hours STD concerns: no risk or activity to increase risk Drug Use: None Encouraged self-testicular check  Health Maintenance  Topic Date Due  . HIV Screening  12/09/1979  . COLONOSCOPY  12/09/2014  . INFLUENZA VACCINE  03/12/2016  . TETANUS/TDAP  03/08/2020   Immunization History  Administered Date(s) Administered  . Td 03/08/2010   Patient Active Problem List   Diagnosis Date Noted  . Smoker 10/30/2011  . OSA (obstructive sleep apnea) 12/06/2010  . Hyperlipidemia 11/09/2010  . FATIGUE 03/05/2010   Past Medical History  Diagnosis Date  . BCC (basal cell carcinoma), face   . Hyperlipidemia   . Sleep apnea   . Smoker 10/30/2011   Past Surgical History  Procedure Laterality Date  . Vasectomy      cope   Social History   Social History  . Marital Status: Married    Spouse Name: N/A  . Number of Children: Y  . Years of Education: N/A   Occupational History  . welder     self-employed  .     Social History Main Topics  . Smoking status: Former Smoker -- 1.00 packs/day for 30 years    Types: Cigarettes    Quit date: 08/24/2015  . Smokeless tobacco: Never Used  . Alcohol Use: No  . Drug Use: No  . Sexual Activity: Not on file   Other Topics Concern  . Not on file   Social  History Narrative   Family History  Problem Relation Age of Onset  . Allergies Mother   . Allergies Father   . Allergies Brother   . Allergies Brother   . Skin cancer Paternal Grandfather    No Known Allergies  Medication list has been reviewed and updated.   General: Denies fever, chills, sweats. No significant weight loss. Eyes: Denies blurring,significant itching ENT: Denies earache, sore throat, and hoarseness. Cardiovascular: Denies chest pains, palpitations, dyspnea on exertion Respiratory: Denies cough, dyspnea at rest,wheeezing Breast: no concerns about lumps GI: Denies nausea, vomiting, diarrhea, constipation, change in bowel habits, abdominal pain, melena, hematochezia GU: Denies penile discharge, ED, urinary flow / outflow problems. No STD concerns. Musculoskeletal: Denies back pain, joint pain. SOME MILD IMPINGEMENT WITH LIFTING WITH SHOULDERS Derm: Denies rash, itching Neuro: Denies  paresthesias, frequent falls, frequent headaches Psych: Denies depression, anxiety Endocrine: Denies cold intolerance, heat intolerance, polydipsia Heme: Denies enlarged lymph nodes Allergy: No hayfever  Objective:   BP 120/72 mmHg  Pulse 76  Temp(Src) 98.6 F (37 C) (Oral)  Ht _0  (1.854 m)  Wt 235 lb 4 oz (106.709 kg)  BMI 31.04 kg/m2 Ideal Body Weight: Weight in (lb) to have BMI = 25: 189.1  No exam data present  GEN: well developed, well nourished, no acute distress Eyes: conjunctiva and lids normal, PERRLA, EOMI ENT: TM clear, nares clear, oral exam WNL  Neck: supple, no lymphadenopathy, no thyromegaly, no JVD Pulm: clear to auscultation and percussion, respiratory effort normal CV: regular rate and rhythm, S1-S2, no murmur, rub or gallop, no bruits, peripheral pulses normal and symmetric, no cyanosis, clubbing, edema or varicosities GI: soft, non-tender; no hepatosplenomegaly, masses; active bowel sounds all quadrants GU: no hernia, testicular mass, penile  discharge Lymph: no cervical, axillary or inguinal adenopathy MSK: gait normal, muscle tone and strength WNL, no joint swelling, effusions, discoloration, crepitus  SKIN: clear, good turgor, color WNL, no rashes, lesions, or ulcerations Neuro: normal mental status, normal strength, sensation, and motion Psych: alert; oriented to person, place and time, normally interactive and not anxious or depressed in appearance. All labs reviewed with patient.  Lipids:    Component Value Date/Time   CHOL 229* 03/01/2016 1530   TRIG 180* 03/01/2016 1530   HDL 43 03/01/2016 1530   LDLDIRECT 203* 08/24/2012 1450   CHOLHDL 5.3* 03/01/2016 1530   CBC: CBC Latest Ref Rng 03/01/2016 10/30/2015 03/17/2015  WBC 3.4 - 10.8 x10E3/uL 8.1 5.6 6.6  Hemoglobin 12.6 - 17.7 g/dL - - -  Hematocrit 37.5 - 51.0 % 42.2 46.1 44.4  Platelets 150 - 379 x10E3/uL 125(L) 117(L) 144(L)    Basic Metabolic Panel:    Component Value Date/Time   NA 141 03/01/2016 1530   K 4.3 03/01/2016 1530   CL 101 03/01/2016 1530   CO2 23 03/01/2016 1530   BUN 14 03/01/2016 1530   CREATININE 0.90 03/01/2016 1530   GLUCOSE 81 03/01/2016 1530   CALCIUM 9.4 03/01/2016 1530   Hepatic Function Latest Ref Rng 03/01/2016 10/30/2015 03/17/2015  Total Protein 6.0 - 8.5 g/dL 7.1 7.1 6.7  Albumin 3.5 - 5.5 g/dL 4.4 4.4 4.2  AST 0 - 40 IU/L _0 ALT 0 - 44 IU/L _1 Alk Phosphatase 39 - 117 IU/L 117 113 112  Total Bilirubin 0.0 - 1.2 mg/dL 0.5 0.3 0.4  Bilirubin, Direct 0.00 - 0.40 mg/dL 0.13 0.09 0.10    No results found for: TSH Lab Results  Component Value Date   PSA 0.4 03/11/2014   PSA 0.4 08/24/2012    Assessment and Plan:   Routine general medical examination at a health care facility  Hyperlipidemia - Plan: Lipid panel, CANCELED: Lipid panel  Screen for colon cancer - Plan: Ambulatory referral to Gastroenterology  Screening PSA (prostate specific antigen) - Plan: PSA, CANCELED: PSA  Screening for diabetes  mellitus - Plan: Hemoglobin A1c, CANCELED: Basic metabolic panel, CANCELED: Hemoglobin A1c  Encounter for long-term (current) use of medications - Plan: Basic metabolic panel, Hepatic function panel, CBC with Differential/Platelet, CANCELED: CBC with Differential/Platelet, CANCELED: Hepatic function panel  Health Maintenance Exam: The patient's preventative maintenance and recommended screening tests for an annual wellness exam were reviewed in full today. Brought up to date unless services declined.  Counselled on the importance of diet, exercise, and its role in overall health and mortality. The patient's FH and SH was reviewed, including their home life, tobacco status, and drug and alcohol status.  Follow-up: No Follow-up on file. Unless noted, follow-up in 1 year for Health Maintenance Exam.  Orders Placed This Encounter  Procedures  . Lipid panel  . PSA  . Basic metabolic panel  . Hepatic function panel  . Hemoglobin A1c  . CBC with Differential/Platelet  . Ambulatory referral to Gastroenterology    Signed,  Frederico Hamman T. Rubylee Zamarripa, MD   Patient's Medications  New Prescriptions   No medications on  file  Previous Medications   APPLE CIDER VINEGAR PO    Take by mouth daily.   GRAPE SEED 50 MG CAPS    Take 1 capsule by mouth daily.   MULTIPLE VITAMIN (MULTIVITAMIN) TABLET    Take 1 tablet by mouth daily.   TADALAFIL (CIALIS) 20 MG TABLET    Take 0.5-1 tablets (10-20 mg total) by mouth every other day as needed for erectile dysfunction.  Modified Medications   No medications on file  Discontinued Medications   No medications on file

## 2016-03-02 LAB — BASIC METABOLIC PANEL
BUN / CREAT RATIO: 16 (ref 9–20)
BUN: 14 mg/dL (ref 6–24)
CHLORIDE: 101 mmol/L (ref 96–106)
CO2: 23 mmol/L (ref 18–29)
Calcium: 9.4 mg/dL (ref 8.7–10.2)
Creatinine, Ser: 0.9 mg/dL (ref 0.76–1.27)
GFR calc non Af Amer: 99 mL/min/{1.73_m2} (ref >59)
GFR, EST AFRICAN AMERICAN: 114 mL/min/{1.73_m2} (ref >59)
GLUCOSE: 81 mg/dL (ref 65–99)
POTASSIUM: 4.3 mmol/L (ref 3.5–5.2)
SODIUM: 141 mmol/L (ref 134–144)

## 2016-03-02 LAB — CBC WITH DIFFERENTIAL/PLATELET
BASOS: 1 %
Basophils Absolute: 0.1 10*3/uL (ref 0.0–0.2)
EOS (ABSOLUTE): 0.2 10*3/uL (ref 0.0–0.4)
EOS: 2 %
HEMATOCRIT: 42.2 % (ref 37.5–51.0)
Hemoglobin: 14.8 g/dL (ref 12.6–17.7)
IMMATURE GRANS (ABS): 0 10*3/uL (ref 0.0–0.1)
IMMATURE GRANULOCYTES: 0 %
Lymphocytes Absolute: 3.1 10*3/uL (ref 0.7–3.1)
Lymphs: 38 %
MCH: 29.9 pg (ref 26.6–33.0)
MCHC: 35.1 g/dL (ref 31.5–35.7)
MCV: 85 fL (ref 79–97)
MONOS ABS: 0.5 10*3/uL (ref 0.1–0.9)
Monocytes: 6 %
NEUTROS PCT: 53 %
Neutrophils Absolute: 4.2 10*3/uL (ref 1.4–7.0)
Platelets: 125 10*3/uL — ABNORMAL LOW (ref 150–379)
RBC: 4.95 x10E6/uL (ref 4.14–5.80)
RDW: 13.5 % (ref 12.3–15.4)
WBC: 8.1 10*3/uL (ref 3.4–10.8)

## 2016-03-02 LAB — HEPATIC FUNCTION PANEL
ALT: 19 IU/L (ref 0–44)
AST: 19 IU/L (ref 0–40)
Albumin: 4.4 g/dL (ref 3.5–5.5)
Alkaline Phosphatase: 117 IU/L (ref 39–117)
BILIRUBIN, DIRECT: 0.13 mg/dL (ref 0.00–0.40)
Bilirubin Total: 0.5 mg/dL (ref 0.0–1.2)
TOTAL PROTEIN: 7.1 g/dL (ref 6.0–8.5)

## 2016-03-02 LAB — LIPID PANEL
CHOL/HDL RATIO: 5.3 ratio — AB (ref 0.0–5.0)
Cholesterol, Total: 229 mg/dL — ABNORMAL HIGH (ref 100–199)
HDL: 43 mg/dL (ref >39)
LDL CALC: 150 mg/dL — AB (ref 0–99)
Triglycerides: 180 mg/dL — ABNORMAL HIGH (ref 0–149)
VLDL CHOLESTEROL CAL: 36 mg/dL (ref 5–40)

## 2016-03-02 LAB — HEMOGLOBIN A1C
Est. average glucose Bld gHb Est-mCnc: 108 mg/dL
Hgb A1c MFr Bld: 5.4 % (ref 4.8–5.6)

## 2016-03-02 LAB — PSA: PROSTATE SPECIFIC AG, SERUM: 0.4 ng/mL (ref 0.0–4.0)

## 2016-03-04 ENCOUNTER — Encounter: Payer: Self-pay | Admitting: *Deleted

## 2016-04-01 ENCOUNTER — Encounter: Payer: Self-pay | Admitting: Family Medicine

## 2016-04-01 ENCOUNTER — Telehealth: Payer: Self-pay | Admitting: Family Medicine

## 2016-04-01 NOTE — Telephone Encounter (Signed)
Pt called with questions regarding screen paperwork.  Can you please call him back to clarify.  He can be reached at [phone]

## 2016-04-01 NOTE — Telephone Encounter (Signed)
Can you call him and see? It should be the standard [facility] screening form?  He may need to get a BMI exemption, and I am happy to do one for him if needed.

## 2016-04-02 NOTE — Telephone Encounter (Signed)
Spoke with Mr. Baily.  He states his company never received to [employer].  Our records showed that is was faxed on 03/04/2016.  Advised Mr. Goeser that I would refax Health Screening Form with lab results right now.  Faxed to [phone].  Received confirmation that fax went through.

## 2016-05-30 ENCOUNTER — Telehealth: Payer: Self-pay | Admitting: Family Medicine

## 2016-05-30 NOTE — Telephone Encounter (Signed)
Pt dropped off health screen form to be filled out for employer. Please call when ready for pick up. I placed in Rx tower.

## 2016-05-30 NOTE — Telephone Encounter (Signed)
[facility] BMI appeal form placed in Dr. Lillie Fragmin in box to complete.

## 2016-06-05 NOTE — Telephone Encounter (Signed)
Brandon Edwards notified form is ready to be picked up at the front desk.

## 2016-06-05 NOTE — Telephone Encounter (Signed)
Pt called to check on form -  he needs it to his employer by 10/31

## 2017-03-24 ENCOUNTER — Ambulatory Visit (INDEPENDENT_AMBULATORY_CARE_PROVIDER_SITE_OTHER): Payer: 59 | Admitting: Family Medicine

## 2017-03-24 ENCOUNTER — Encounter: Payer: Self-pay | Admitting: Family Medicine

## 2017-03-24 ENCOUNTER — Encounter: Payer: Self-pay | Admitting: Gastroenterology

## 2017-03-24 VITALS — BP 140/90 | HR 63 | Temp 97.6°F | Resp 18 | Ht 73.0 in | Wt 253.0 lb

## 2017-03-24 DIAGNOSIS — Z0000 Encounter for general adult medical examination without abnormal findings: Secondary | ICD-10-CM | POA: Diagnosis not present

## 2017-03-24 DIAGNOSIS — Z125 Encounter for screening for malignant neoplasm of prostate: Secondary | ICD-10-CM | POA: Diagnosis not present

## 2017-03-24 NOTE — Progress Notes (Signed)
Dr. Frederico Hamman T. Cyndia Degraff, MD, [facility] [place], [address] Phone: [phone] Fax: [phone]  03/24/2017  Patient: Brandon Edwards, MRN: [id number], DOB: Dec 17, 1965, 51 y.o.  Primary Physician:  Owens Loffler, MD   Chief Complaint  Patient presents with  . Annual Exam   Subjective:   Brandon Edwards is a 51 y.o. pleasant patient who presents with the following:  Preventative Health Maintenance Visit:  Health Maintenance Summary Reviewed and updated, unless pt declines services.  Tobacco History Reviewed. Alcohol: No concerns, no excessive use Exercise Habits: Some activity, rec at least 30 mins 5 times a week STD concerns: no risk or activity to increase risk Drug Use: None Encouraged self-testicular check  Health Maintenance  Topic Date Due  . HIV Screening  12/09/1979  . COLONOSCOPY  12/09/2014  . INFLUENZA VACCINE  03/12/2017  . TETANUS/TDAP  03/08/2020   Immunization History  Administered Date(s) Administered  . Td 03/08/2010   Patient Active Problem List   Diagnosis Date Noted  . Smoker 10/30/2011  . OSA (obstructive sleep apnea) 12/06/2010  . Hyperlipidemia 11/09/2010  . FATIGUE 03/05/2010   Past Medical History:  Diagnosis Date  . BCC (basal cell carcinoma), face   . Hyperlipidemia   . Sleep apnea   . Smoker 10/30/2011   Past Surgical History:  Procedure Laterality Date  . VASECTOMY     cope   Social History   Social History  . Marital status: Married    Spouse name: N/A  . Number of children: Y  . Years of education: N/A   Occupational History  . welder     self-employed  .  Self Employed   Social History Main Topics  . Smoking status: Former Smoker    Packs/day: 1.00    Years: 30.00    Types: Cigarettes    Quit date: 08/24/2015  . Smokeless tobacco: Never Used  . Alcohol use No  . Drug use: No  . Sexual activity: Not on file   Other Topics Concern  . Not  on file   Social History Narrative  . No narrative on file   Family History  Problem Relation Age of Onset  . Allergies Mother   . Allergies Father   . Allergies Brother   . Allergies Brother   . Skin cancer Paternal Grandfather    No Known Allergies  Medication list has been reviewed and updated.   General: Denies fever, chills, sweats. No significant weight loss. Eyes: Denies blurring,significant itching ENT: Denies earache, sore throat, and hoarseness. Cardiovascular: Denies chest pains, palpitations, dyspnea on exertion Respiratory: Denies cough, dyspnea at rest,wheeezing Breast: no concerns about lumps GI: Denies nausea, vomiting, diarrhea, constipation, change in bowel habits, abdominal pain, melena, hematochezia GU: Denies penile discharge, ED, urinary flow / outflow problems. No STD concerns. Musculoskeletal: Denies back pain, joint pain Derm: Denies rash, itching Neuro: Denies  paresthesias, frequent falls, frequent headaches Psych: Denies depression, anxiety Endocrine: Denies cold intolerance, heat intolerance, polydipsia Heme: Denies enlarged lymph nodes Allergy: No hayfever  Objective:   BP 140/90   Pulse 63   Temp 97.6 F (36.4 C)   Resp 18   Ht 6' 1"  (1.854 m)   Wt 253 lb (114.8 kg)   SpO2 97%   BMI 33.38 kg/m  Ideal Body Weight: Weight in (lb) to have BMI = 25: 189.1  No exam data present  GEN: well developed, well nourished, no acute distress Eyes: conjunctiva  and lids normal, PERRLA, EOMI ENT: TM clear, nares clear, oral exam WNL Neck: supple, no lymphadenopathy, no thyromegaly, no JVD Pulm: clear to auscultation and percussion, respiratory effort normal CV: regular rate and rhythm, S1-S2, no murmur, rub or gallop, no bruits, peripheral pulses normal and symmetric, no cyanosis, clubbing, edema or varicosities GI: soft, non-tender; no hepatosplenomegaly, masses; active bowel sounds all quadrants GU: no hernia, testicular mass, penile  discharge Lymph: no cervical, axillary or inguinal adenopathy MSK: gait normal, muscle tone and strength WNL, no joint swelling, effusions, discoloration, crepitus  SKIN: clear, good turgor, color WNL, no rashes, lesions, or ulcerations Neuro: normal mental status, normal strength, sensation, and motion Psych: alert; oriented to person, place and time, normally interactive and not anxious or depressed in appearance. All labs reviewed with patient.  Lipids:    Component Value Date/Time   CHOL 229 (H) 03/01/2016 1530   TRIG 180 (H) 03/01/2016 1530   HDL 43 03/01/2016 1530   LDLDIRECT 203 (H) 08/24/2012 1450   CHOLHDL 5.3 (H) 03/01/2016 1530   CBC: CBC Latest Ref Rng & Units 03/01/2016 10/30/2015 03/17/2015  WBC 3.4 - 10.8 x10E3/uL 8.1 5.6 6.6  Hemoglobin 12.6 - 17.7 g/dL 14.8 16.1 15.1  Hematocrit 37.5 - 51.0 % 42.2 46.1 44.4  Platelets 150 - 379 x10E3/uL 125(L) 117(L) 144(L)    Basic Metabolic Panel:    Component Value Date/Time   NA 141 03/01/2016 1530   K 4.3 03/01/2016 1530   CL 101 03/01/2016 1530   CO2 23 03/01/2016 1530   BUN 14 03/01/2016 1530   CREATININE 0.90 03/01/2016 1530   GLUCOSE 81 03/01/2016 1530   CALCIUM 9.4 03/01/2016 1530   Hepatic Function Latest Ref Rng & Units 03/01/2016 10/30/2015 03/17/2015  Total Protein 6.0 - 8.5 g/dL 7.1 7.1 6.7  Albumin 3.5 - 5.5 g/dL 4.4 4.4 4.2  AST 0 - 40 IU/L 19 23 14   ALT 0 - 44 IU/L 19 30 27   Alk Phosphatase 39 - 117 IU/L 117 113 112  Total Bilirubin 0.0 - 1.2 mg/dL 0.5 0.3 0.4  Bilirubin, Direct 0.00 - 0.40 mg/dL 0.13 0.09 0.10    No results found for: TSH Lab Results  Component Value Date   PSA 0.4 03/11/2014   PSA 0.4 08/24/2012    Assessment and Plan:   Healthcare maintenance - Plan: CBC with Differential/Platelet, Basic metabolic panel, Hepatic function panel, Hemoglobin A1c, Lipid panel, PSA  Screening PSA (prostate specific antigen) - Plan: PSA  Health Maintenance Exam: The patient's preventative maintenance  and recommended screening tests for an annual wellness exam were reviewed in full today. Brought up to date unless services declined.  Counselled on the importance of diet, exercise, and its role in overall health and mortality. The patient's FH and SH was reviewed, including their home life, tobacco status, and drug and alcohol status.  Follow-up in 1 year for physical exam or additional follow-up below.  Follow-up: No Follow-up on file. Or follow-up in 1 year if not noted.  No orders of the defined types were placed in this encounter.  Medications Discontinued During This Encounter  Medication Reason  . Grape Seed 50 MG CAPS Error  . tadalafil (CIALIS) 20 MG tablet Error   Orders Placed This Encounter  Procedures  . CBC with Differential/Platelet  . Basic metabolic panel  . Hepatic function panel  . Hemoglobin A1c  . Lipid panel  . PSA    Signed,  Frederico Hamman T. Nakul Avino, MD   Allergies as of  03/24/2017   No Known Allergies     Medication List       Accurate as of 03/24/17 12:56 PM. Always use your most recent med list.          APPLE CIDER VINEGAR PO Take by mouth daily.   multivitamin tablet Take 1 tablet by mouth daily.

## 2017-03-24 NOTE — Patient Instructions (Signed)

## 2017-03-25 LAB — BASIC METABOLIC PANEL
BUN/Creatinine Ratio: 13 (ref 9–20)
BUN: 12 mg/dL (ref 6–24)
CALCIUM: 9.1 mg/dL (ref 8.7–10.2)
CO2: 24 mmol/L (ref 20–29)
Chloride: 104 mmol/L (ref 96–106)
Creatinine, Ser: 0.96 mg/dL (ref 0.76–1.27)
GFR calc Af Amer: 105 mL/min/{1.73_m2} (ref >59)
GFR, EST NON AFRICAN AMERICAN: 91 mL/min/{1.73_m2} (ref >59)
Glucose: 92 mg/dL (ref 65–99)
POTASSIUM: 4.4 mmol/L (ref 3.5–5.2)
SODIUM: 140 mmol/L (ref 134–144)

## 2017-03-25 LAB — CBC WITH DIFFERENTIAL/PLATELET
BASOS: 1 %
Basophils Absolute: 0.1 10*3/uL (ref 0.0–0.2)
EOS (ABSOLUTE): 0.3 10*3/uL (ref 0.0–0.4)
EOS: 4 %
HEMATOCRIT: 46.2 % (ref 37.5–51.0)
Hemoglobin: 16.4 g/dL (ref 13.0–17.7)
Immature Grans (Abs): 0 10*3/uL (ref 0.0–0.1)
Immature Granulocytes: 0 %
LYMPHS ABS: 2.6 10*3/uL (ref 0.7–3.1)
Lymphs: 38 %
MCH: 30.6 pg (ref 26.6–33.0)
MCHC: 35.5 g/dL (ref 31.5–35.7)
MCV: 86 fL (ref 79–97)
MONOS ABS: 0.5 10*3/uL (ref 0.1–0.9)
Monocytes: 7 %
Neutrophils Absolute: 3.4 10*3/uL (ref 1.4–7.0)
Neutrophils: 50 %
Platelets: 123 10*3/uL — ABNORMAL LOW (ref 150–379)
RBC: 5.36 x10E6/uL (ref 4.14–5.80)
RDW: 14.1 % (ref 12.3–15.4)
WBC: 6.9 10*3/uL (ref 3.4–10.8)

## 2017-03-25 LAB — HEMOGLOBIN A1C
Est. average glucose Bld gHb Est-mCnc: 117 mg/dL
Hgb A1c MFr Bld: 5.7 % — ABNORMAL HIGH (ref 4.8–5.6)

## 2017-03-25 LAB — LIPID PANEL
CHOLESTEROL TOTAL: 251 mg/dL — AB (ref 100–199)
Chol/HDL Ratio: 6.6 ratio — ABNORMAL HIGH (ref 0.0–5.0)
HDL: 38 mg/dL — ABNORMAL LOW (ref >39)
LDL CALC: 181 mg/dL — AB (ref 0–99)
Triglycerides: 160 mg/dL — ABNORMAL HIGH (ref 0–149)
VLDL Cholesterol Cal: 32 mg/dL (ref 5–40)

## 2017-03-25 LAB — HEPATIC FUNCTION PANEL
ALT: 28 IU/L (ref 0–44)
AST: 18 IU/L (ref 0–40)
Albumin: 4.6 g/dL (ref 3.5–5.5)
Alkaline Phosphatase: 102 IU/L (ref 39–117)
BILIRUBIN TOTAL: 0.3 mg/dL (ref 0.0–1.2)
Bilirubin, Direct: 0.08 mg/dL (ref 0.00–0.40)
Total Protein: 6.7 g/dL (ref 6.0–8.5)

## 2017-03-25 LAB — PSA: PROSTATE SPECIFIC AG, SERUM: 0.4 ng/mL (ref 0.0–4.0)

## 2017-03-27 ENCOUNTER — Telehealth: Payer: Self-pay | Admitting: Family Medicine

## 2017-03-27 NOTE — Telephone Encounter (Signed)
LMOM TCB x1 to pt, I was calling pt to inform him that we finished filling out his eHealth screen physician form and wanted to know did he want to pick it up or for [place] to mail it to him  FYI: Form is in Donna's to do box

## 2017-03-31 NOTE — Telephone Encounter (Signed)
Left message for Brandon Edwards that I am going to fax in his Health Assessment form as well as leave the original up front for him to pick up.

## 2017-05-13 ENCOUNTER — Encounter: Payer: 59 | Admitting: Gastroenterology

## 2017-05-19 ENCOUNTER — Telehealth: Payer: Self-pay

## 2017-05-19 NOTE — Telephone Encounter (Signed)
Pt was seen for annual exam on 03/24/17; cialis was d/c during the 03/24/17 visit. Pt wants to know if he could get another rx for cialis. Pt request cb.Please advise.

## 2017-05-20 MED ORDER — TADALAFIL 20 MG PO TABS: ORAL_TABLET | ORAL | 11 refills | Status: DC

## 2017-05-20 NOTE — Telephone Encounter (Signed)
cialis 20 mg, 1/2 - 1 tab 30 mins before intercourse. #10, 11 ref

## 2017-05-20 NOTE — Telephone Encounter (Signed)
Brandon Edwards notified prescription has been sent into his pharmacy

## 2017-06-09 ENCOUNTER — Ambulatory Visit (AMBULATORY_SURGERY_CENTER): Payer: Self-pay

## 2017-06-09 VITALS — Ht 73.0 in | Wt 256.0 lb

## 2017-06-09 DIAGNOSIS — Z1211 Encounter for screening for malignant neoplasm of colon: Secondary | ICD-10-CM

## 2017-06-09 MED ORDER — SUPREP BOWEL PREP KIT 17.5-3.13-1.6 GM/177ML PO SOLN: 1.0000 | Freq: Once | ORAL | 0 refills | Status: AC

## 2017-06-09 NOTE — Progress Notes (Signed)
No allergies to eggs or soy No diet meds No home oxygen No past problems with anesthesia  Registered emmi 

## 2017-06-10 ENCOUNTER — Encounter: Payer: Self-pay | Admitting: Gastroenterology

## 2017-06-23 ENCOUNTER — Ambulatory Visit (AMBULATORY_SURGERY_CENTER): Payer: 59 | Admitting: Gastroenterology

## 2017-06-23 ENCOUNTER — Other Ambulatory Visit: Payer: Self-pay

## 2017-06-23 ENCOUNTER — Encounter: Payer: Self-pay | Admitting: Gastroenterology

## 2017-06-23 VITALS — BP 108/72 | HR 57 | Temp 98.0°F | Resp 10 | Ht 73.0 in | Wt 253.0 lb

## 2017-06-23 DIAGNOSIS — Z1211 Encounter for screening for malignant neoplasm of colon: Secondary | ICD-10-CM | POA: Diagnosis not present

## 2017-06-23 DIAGNOSIS — Z1212 Encounter for screening for malignant neoplasm of rectum: Secondary | ICD-10-CM

## 2017-06-23 DIAGNOSIS — D123 Benign neoplasm of transverse colon: Secondary | ICD-10-CM | POA: Diagnosis not present

## 2017-06-23 DIAGNOSIS — D120 Benign neoplasm of cecum: Secondary | ICD-10-CM | POA: Diagnosis not present

## 2017-06-23 MED ORDER — SODIUM CHLORIDE 0.9 % IV SOLN: 500.0000 mL | INTRAVENOUS | Status: DC

## 2017-06-23 NOTE — Progress Notes (Signed)
Pt's states no medical or surgical changes since previsit or office visit. 

## 2017-06-23 NOTE — Op Note (Signed)
[facility] Patient Name: Brandon Edwards Procedure Date: 06/23/2017 10:35 AM MRN: [id number] Endoscopist: Mauri Pole , MD Age: 51 Referring MD:  Date of Birth: May 21, 1966 Gender: Male Account #: [id number] Procedure:                Colonoscopy Indications:              Screening for colorectal malignant neoplasm Medicines:                Monitored Anesthesia Care Procedure:                Pre-Anesthesia Assessment:                           - Prior to the procedure, a History and Physical                            was performed, and patient medications and                            allergies were reviewed. The patient's tolerance of                            previous anesthesia was also reviewed. The risks                            and benefits of the procedure and the sedation                            options and risks were discussed with the patient.                            All questions were answered, and informed consent                            was obtained. Prior Anticoagulants: The patient has                            taken no previous anticoagulant or antiplatelet                            agents. ASA Grade Assessment: II - A patient with                            mild systemic disease. After reviewing the risks                            and benefits, the patient was deemed in                            satisfactory condition to undergo the procedure.                           After obtaining informed consent, the colonoscope  was passed under direct vision. Throughout the                            procedure, the patient's blood pressure, pulse, and                            oxygen saturations were monitored continuously. The                            Colonoscope was introduced through the anus and                            advanced to the the cecum, identified by                            appendiceal  orifice and ileocecal valve. The                            colonoscopy was performed without difficulty. The                            patient tolerated the procedure well. The quality                            of the bowel preparation was excellent. The                            terminal ileum, ileocecal valve, appendiceal                            orifice, and rectum were photographed. Scope In: 10:43:11 AM Scope Out: 10:59:28 AM Scope Withdrawal Time: 0 hours 12 minutes 6 seconds  Total Procedure Duration: 0 hours 16 minutes 17 seconds  Findings:                 The perianal and digital rectal examinations were                            normal.                           Two sessile polyps were found in the transverse                            colon and cecum. The polyps were 1 to 2 mm in size.                            These polyps were removed with a cold biopsy                            forceps. Resection and retrieval were complete.                           Non-bleeding internal hemorrhoids were found during  retroflexion. The hemorrhoids were small.                           The exam was otherwise without abnormality. Complications:            No immediate complications. Estimated Blood Loss:     Estimated blood loss was minimal. Impression:               - Two 1 to 2 mm polyps in the transverse colon and                            in the cecum, removed with a cold biopsy forceps.                            Resected and retrieved.                           - Non-bleeding internal hemorrhoids.                           - The examination was otherwise normal. Recommendation:           - Patient has a contact number available for                            emergencies. The signs and symptoms of potential                            delayed complications were discussed with the                            patient. Return to normal activities tomorrow.                             Written discharge instructions were provided to the                            patient.                           - Resume previous diet.                           - Continue present medications.                           - Await pathology results.                           - Repeat colonoscopy in 5-10 years for surveillance                            based on pathology results. Mauri Pole, MD 06/23/2017 11:03:24 AM This report has been signed electronically.

## 2017-06-23 NOTE — Progress Notes (Signed)
To recovery, report to RN, VSS. 

## 2017-06-23 NOTE — Progress Notes (Signed)
Called to room to assist during endoscopic procedure.  Patient ID and intended procedure confirmed with present staff. Received instructions for my participation in the procedure from the performing physician.  

## 2017-06-23 NOTE — Patient Instructions (Signed)
Discharge instructions given. Handouts on polyps and hemorrhoids. Resume previous medications. YOU HAD AN ENDOSCOPIC PROCEDURE TODAY AT THE [facility]:   Refer to the procedure report that was given to you for any specific questions about what was found during the examination.  If the procedure report does not answer your questions, please call your gastroenterologist to clarify.  If you requested that your care partner not be given the details of your procedure findings, then the procedure report has been included in a sealed envelope for you to review at your convenience later.  YOU SHOULD EXPECT: Some feelings of bloating in the abdomen. Passage of more gas than usual.  Walking can help get rid of the air that was put into your GI tract during the procedure and reduce the bloating. If you had a lower endoscopy (such as a colonoscopy or flexible sigmoidoscopy) you may notice spotting of blood in your stool or on the toilet paper. If you underwent a bowel prep for your procedure, you may not have a normal bowel movement for a few days.  Please Note:  You might notice some irritation and congestion in your nose or some drainage.  This is from the oxygen used during your procedure.  There is no need for concern and it should clear up in a day or so.  SYMPTOMS TO REPORT IMMEDIATELY:   Following lower endoscopy (colonoscopy or flexible sigmoidoscopy):  Excessive amounts of blood in the stool  Significant tenderness or worsening of abdominal pains  Swelling of the abdomen that is new, acute  Fever of 100F or higher   For urgent or emergent issues, a gastroenterologist can be reached at any hour by calling [phone].   DIET:  We do recommend a small meal at first, but then you may proceed to your regular diet.  Drink plenty of fluids but you should avoid alcoholic beverages for 24 hours.  ACTIVITY:  You should plan to take it easy for the rest of today and you should NOT DRIVE  or use heavy machinery until tomorrow (because of the sedation medicines used during the test).    FOLLOW UP: Our staff will call the number listed on your records the next business day following your procedure to check on you and address any questions or concerns that you may have regarding the information given to you following your procedure. If we do not reach you, we will leave a message.  However, if you are feeling well and you are not experiencing any problems, there is no need to return our call.  We will assume that you have returned to your regular daily activities without incident.  If any biopsies were taken you will be contacted by phone or by letter within the next 1-3 weeks.  Please call us at [phone] if you have not heard about the biopsies in 3 weeks.    SIGNATURES/CONFIDENTIALITY: You and/or your care partner have signed paperwork which will be entered into your electronic medical record.  These signatures attest to the fact that that the information above on your After Visit Summary has been reviewed and is understood.  Full responsibility of the confidentiality of this discharge information lies with you and/or your care-partner. 

## 2017-06-24 ENCOUNTER — Telehealth: Payer: Self-pay | Admitting: *Deleted

## 2017-06-24 NOTE — Telephone Encounter (Signed)
  Follow up Call-  Call back number 06/23/2017  Post procedure Call Back phone  # [phone]  Permission to leave phone message Yes  Some recent data might be hidden     Patient questions:  Do you have a fever, pain , or abdominal swelling? No. Pain Score  0 *  Have you tolerated food without any problems? Yes.    Have you been able to return to your normal activities? Yes.    Do you have any questions about your discharge instructions: Diet   No. Medications  No. Follow up visit  No.  Do you have questions or concerns about your Care? No.  Actions: * If pain score is 4 or above: No action needed, pain <4.

## 2017-06-27 ENCOUNTER — Encounter: Payer: Self-pay | Admitting: Gastroenterology

## 2017-10-14 ENCOUNTER — Telehealth: Payer: Self-pay | Admitting: *Deleted

## 2017-10-14 NOTE — Telephone Encounter (Signed)
Copied from [place] [phone]. Topic: Quick Communication - See Telephone Encounter >> Oct 14, 2017  3:45 PM Arletha Grippe wrote: CRM for notification. See Telephone encounter for:   10/14/17. Pt called - he is asking for a call by donna, he would not tell me why  Please call [phone]

## 2017-10-14 NOTE — Telephone Encounter (Signed)
Spoke with Mr. Brandon Edwards.  He states he is currently taking Cialis and at the first of the year the cost increased to $240.  He is wanting to know if there is something cheaper that Dr. Lorelei Pont could prescribe.  I advised that a lot of insurance companies are no longer covering ED medications.  I recommended that he call his insurance to see if there is a ED medication on their formulary and call us back and let us know if there is, then I can have Dr. Lorelei Pont send in new Rx.  He also states he needed to see an ENT for deviated septum and wanted to know if he needed a referral.  I advised he should able to call and set up his own appointment but to call us back if they tell him differently and he does need a referral.  Patient states understanding.

## 2017-10-14 NOTE — Telephone Encounter (Signed)
agreed

## 2017-11-29 DIAGNOSIS — J209 Acute bronchitis, unspecified: Secondary | ICD-10-CM | POA: Diagnosis not present

## 2017-12-30 DIAGNOSIS — F4325 Adjustment disorder with mixed disturbance of emotions and conduct: Secondary | ICD-10-CM | POA: Diagnosis not present

## 2018-01-01 DIAGNOSIS — F4325 Adjustment disorder with mixed disturbance of emotions and conduct: Secondary | ICD-10-CM | POA: Diagnosis not present

## 2018-01-06 DIAGNOSIS — F4325 Adjustment disorder with mixed disturbance of emotions and conduct: Secondary | ICD-10-CM | POA: Diagnosis not present

## 2018-01-07 DIAGNOSIS — F4325 Adjustment disorder with mixed disturbance of emotions and conduct: Secondary | ICD-10-CM | POA: Diagnosis not present

## 2018-01-13 DIAGNOSIS — F4325 Adjustment disorder with mixed disturbance of emotions and conduct: Secondary | ICD-10-CM | POA: Diagnosis not present

## 2018-10-11 NOTE — Progress Notes (Deleted)
Dr. Frederico Hamman T. Daun Rens, MD, [facility] [place], [address] Phone: [phone] Fax: [phone]  10/12/2018  Patient: Brandon Edwards, MRN: [id number], DOB: 11/29/1965, 53 y.o.  Primary Physician:  Owens Loffler, MD   No chief complaint on file.  Subjective:   Brandon Edwards is a 53 y.o. very pleasant male patient who presents with the following:  Brandon Edwards is a well-known patient, and he presents with some prostate issues.    Past Medical History, Surgical History, Social History, Family History, Problem List, Medications, and Allergies have been reviewed and updated if relevant.  Patient Active Problem List   Diagnosis Date Noted  . Smoker 10/30/2011  . OSA (obstructive sleep apnea) 12/06/2010  . Hyperlipidemia 11/09/2010  . FATIGUE 03/05/2010    Past Medical History:  Diagnosis Date  . BCC (basal cell carcinoma), face   . Deviated septum   . Hyperlipidemia   . Sleep apnea   . Smoker 10/30/2011    Past Surgical History:  Procedure Laterality Date  . VASECTOMY     cope  . WISDOM TOOTH EXTRACTION     age 28    Social History   Socioeconomic History  . Marital status: Married    Spouse name: Not on file  . Number of children: Y  . Years of education: Not on file  . Highest education level: Not on file  Occupational History  . 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 Financial resource strain: Not on file  . Food insecurity:    Worry: Not on file    Inability: Not on file  . Transportation needs:    Medical: Not on file    Non-medical: Not on file  Tobacco Use  . Smoking status: Current Every Day Smoker    Packs/day: 1.00    Years: 30.00    Pack years: 30.00    Types: Cigarettes    Last attempt to quit: 08/24/2015    Years since quitting: 3.1  . Smokeless tobacco: Never Used  Substance and Sexual Activity  . Alcohol use: No   Alcohol/week: 0.0 standard drinks  . Drug use: No  . Sexual activity: Not on file  Lifestyle  . Physical activity:    Days per week: Not on file    Minutes per session: Not on file  . Stress: Not on file  Relationships  . Social connections:    Talks on phone: Not on file    Gets together: Not on file    Attends religious service: Not on file    Active member of club or organization: Not on file    Attends meetings of clubs or organizations: Not on file    Relationship status: Not on file  . Intimate partner violence:    Fear of current or ex partner: Not on file    Emotionally abused: Not on file    Physically abused: Not on file    Forced sexual activity: Not on file  Other Topics Concern  . Not on file  Social History Narrative  . Not on file    Family History  Problem Relation Age of Onset  . Allergies Mother   . Allergies Father   . Allergies Brother   . Allergies Brother   . Skin cancer Paternal Grandfather   . Colon cancer Neg Hx   . Esophageal cancer Neg Hx   . Pancreatic  cancer Neg Hx   . Stomach cancer Neg Hx     No Known Allergies  Medication list reviewed and updated in full in [facility].   GEN: No acute illnesses, no fevers, chills. GI: No n/v/d, eating normally Pulm: No SOB Interactive and getting along well at home.  Otherwise, ROS is as per the HPI.  Objective:   There were no vitals taken for this visit.  GEN: WDWN, NAD, Non-toxic, A & O x 3 HEENT: Atraumatic, Normocephalic. Neck supple. No masses, No LAD. Ears and Nose: No external deformity. CV: RRR, No M/G/R. No JVD. No thrill. No extra heart sounds. PULM: CTA B, no wheezes, crackles, rhonchi. No retractions. No resp. distress. No accessory muscle use. EXTR: No c/c/e NEURO Normal gait.  PSYCH: Normally interactive. Conversant. Not depressed or anxious appearing.  Calm demeanor.   Laboratory and Imaging Data:  Assessment and Plan:   ***

## 2018-10-12 ENCOUNTER — Ambulatory Visit: Payer: 59 | Admitting: Family Medicine

## 2018-10-14 ENCOUNTER — Ambulatory Visit: Payer: Self-pay | Admitting: Family Medicine

## 2018-10-17 NOTE — Progress Notes (Signed)
Dr. Frederico Hamman T. Kayven Aldaco, MD, [facility] [place], [address] Phone: [phone] Fax: [phone]  10/19/2018  Patient: Brandon Edwards, MRN: [id number], DOB: Apr 05, 1966, 53 y.o.  Primary Physician:  Owens Loffler, MD   Chief Complaint  Patient presents with  . Urinary Frequency   Subjective:   Brandon Edwards is a 53 y.o. pleasant patient who presents with the following:  Preventative Health Maintenance Visit:  Health Maintenance Summary Reviewed and updated, unless pt declines services.  Tobacco History Reviewed. Alcohol: No concerns, no excessive use Exercise Habits: Some activity, rec at least 30 mins 5 times a week STD concerns: no risk or activity to increase risk Drug Use: None Encouraged self-testicular check  Very nice guy, he is having issues with his prostate.  Stream is not as strong, more frequent urination.  Thinks empty his bladder.  No UTI in about 2 years. Separated from wife now.   BP Readings from Last 3 Encounters:  10/19/18 (!) 150/100  06/23/17 108/72  03/24/17 140/90    Health Maintenance  Topic Date Due  . HIV Screening  12/09/1979  . INFLUENZA VACCINE  11/10/2018 (Originally 03/12/2018)  . TETANUS/TDAP  03/08/2020  . COLONOSCOPY  06/23/2022   Immunization History  Administered Date(s) Administered  . Td 03/08/2010   Patient Active Problem List   Diagnosis Date Noted  . Smoker 10/30/2011  . OSA (obstructive sleep apnea) 12/06/2010  . Hyperlipidemia 11/09/2010  . FATIGUE 03/05/2010   Past Medical History:  Diagnosis Date  . BCC (basal cell carcinoma), face   . Deviated septum   . Hyperlipidemia   . Sleep apnea   . Smoker 10/30/2011   Past Surgical History:  Procedure Laterality Date  . VASECTOMY     cope  . WISDOM TOOTH EXTRACTION     age 44   Social History   Socioeconomic History  . Marital status: Married    Spouse name: Not on file  . Number of  children: Y  . Years of education: Not on file  . Highest education level: Not on file  Occupational History  . 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 Financial resource strain: Not on file  . Food insecurity:    Worry: Not on file    Inability: Not on file  . Transportation needs:    Medical: Not on file    Non-medical: Not on file  Tobacco Use  . Smoking status: Current Every Day Smoker    Packs/day: 1.00    Years: 30.00    Pack years: 30.00    Types: Cigarettes    Last attempt to quit: 08/24/2015    Years since quitting: 3.1  . Smokeless tobacco: Never Used  Substance and Sexual Activity  . Alcohol use: No    Alcohol/week: 0.0 standard drinks  . Drug use: No  . Sexual activity: Not on file  Lifestyle  . Physical activity:    Days per week: Not on file    Minutes per session: Not on file  . Stress: Not on file  Relationships  . Social connections:    Talks on phone: Not on file    Gets together: Not on file    Attends religious service: Not on file    Active member of club or organization: Not on file    Attends meetings of clubs or organizations: Not on file  Relationship status: Not on file  . Intimate partner violence:    Fear of current or ex partner: Not on file    Emotionally abused: Not on file    Physically abused: Not on file    Forced sexual activity: Not on file  Other Topics Concern  . Not on file  Social History Narrative  . Not on file   Family History  Problem Relation Age of Onset  . Allergies Mother   . Allergies Father   . Allergies Brother   . Allergies Brother   . Skin cancer Paternal Grandfather   . Colon cancer Neg Hx   . Esophageal cancer Neg Hx   . Pancreatic cancer Neg Hx   . Stomach cancer Neg Hx    No Known Allergies  Medication list has been reviewed and updated.   General: Denies fever, chills, sweats. No significant weight loss. Eyes: Denies blurring,significant  itching ENT: Denies earache, sore throat, and hoarseness. Cardiovascular: Denies chest pains, palpitations, dyspnea on exertion Respiratory: Denies cough, dyspnea at rest,wheeezing Breast: no concerns about lumps GI: Denies nausea, vomiting, diarrhea, constipation, change in bowel habits, abdominal pain, melena, hematochezia GU: Denies penile discharge, ED, urinary flow / outflow problems. No STD concerns. Musculoskeletal: Denies back pain, joint pain Derm: Denies rash, itching Neuro: Denies  paresthesias, frequent falls, frequent headaches Psych: Denies depression, anxiety Endocrine: Denies cold intolerance, heat intolerance, polydipsia Heme: Denies enlarged lymph nodes Allergy: No hayfever  Objective:   BP (!) 150/100   Pulse 67   Temp (!) 97.5 F (36.4 C) (Oral)   Ht 6' 0.5" (1.842 m)   Wt 262 lb 8 oz (119.1 kg)   BMI 35.11 kg/m  Ideal Body Weight: Weight in (lb) to have BMI = 25: 186.5  No exam data present  GEN: well developed, well nourished, no acute distress Eyes: conjunctiva and lids normal, PERRLA, EOMI ENT: TM clear, nares clear, oral exam WNL Neck: supple, no lymphadenopathy, no thyromegaly, no JVD Pulm: clear to auscultation and percussion, respiratory effort normal CV: regular rate and rhythm, S1-S2, no murmur, rub or gallop, no bruits, peripheral pulses normal and symmetric, no cyanosis, clubbing, edema or varicosities GI: soft, non-tender; no hepatosplenomegaly, masses; active bowel sounds all quadrants GU: no hernia, testicular mass, penile discharge Lymph: no cervical, axillary or inguinal adenopathy MSK: gait normal, muscle tone and strength WNL, no joint swelling, effusions, discoloration, crepitus  SKIN: clear, good turgor, color WNL, no rashes, lesions, or ulcerations Neuro: normal mental status, normal strength, sensation, and motion Psych: alert; oriented to person, place and time, normally interactive and not anxious or depressed in appearance. All  labs reviewed with patient.  Lipids: Lab Results  Component Value Date   CHOL 251 (H) 03/24/2017   Lab Results  Component Value Date   HDL 38 (L) 03/24/2017   Lab Results  Component Value Date   LDLCALC 181 (H) 03/24/2017   Lab Results  Component Value Date   TRIG 160 (H) 03/24/2017   Lab Results  Component Value Date   CHOLHDL 6.6 (H) 03/24/2017   CBC: CBC Latest Ref Rng & Units 03/24/2017 03/01/2016 10/30/2015  WBC 3.4 - 10.8 x10E3/uL 6.9 8.1 5.6  Hemoglobin 13.0 - 17.7 g/dL 16.4 14.8 16.1  Hematocrit 37.5 - 51.0 % 46.2 42.2 46.1  Platelets 150 - 379 x10E3/uL 123(L) 125(L) 117(L)    Basic Metabolic Panel:    Component Value Date/Time   NA 140 03/24/2017 0936   K 4.4 03/24/2017 0936  CL 104 03/24/2017 0936   CO2 24 03/24/2017 0936   BUN 12 03/24/2017 0936   CREATININE 0.96 03/24/2017 0936   GLUCOSE 92 03/24/2017 0936   CALCIUM 9.1 03/24/2017 0936   Hepatic Function Latest Ref Rng & Units 03/24/2017 03/01/2016 10/30/2015  Total Protein 6.0 - 8.5 g/dL 6.7 7.1 7.1  Albumin 3.5 - 5.5 g/dL 4.6 4.4 4.4  AST 0 - 40 IU/L 18 19 23   ALT 0 - 44 IU/L 28 19 30   Alk Phosphatase 39 - 117 IU/L 102 117 113  Total Bilirubin 0.0 - 1.2 mg/dL 0.3 0.5 0.3  Bilirubin, Direct 0.00 - 0.40 mg/dL 0.08 0.13 0.09    Lab Results  Component Value Date   HGBA1C 5.7 (H) 03/24/2017   No results found for: TSH Lab Results  Component Value Date   PSA 0.4 03/11/2014   PSA 0.4 08/24/2012    Assessment and Plan:   Healthcare maintenance  Urinary frequency - Plan: POCT Urinalysis Dipstick (Automated), PSA, total and free  BPH with obstruction/lower urinary tract symptoms - Plan: PSA, total and free  Hyperglycemia - Plan: Basic metabolic panel, Hemoglobin A1c  Other fatigue - Plan: CBC with Differential/Platelet, Hepatic function panel  Screening, lipid - Plan: Lipid panel  Check PSA and start flomax  Health Maintenance Exam: The patient's preventative maintenance and recommended  screening tests for an annual wellness exam were reviewed in full today. Brought up to date unless services declined.  Counselled on the importance of diet, exercise, and its role in overall health and mortality. The patient's FH and SH was reviewed, including their home life, tobacco status, and drug and alcohol status.  Follow-up in 1 year for physical exam or additional follow-up below.  Follow-up: No follow-ups on file. Or follow-up in 1 year if not noted  Meds ordered this encounter  Medications  . tamsulosin (FLOMAX) 0.4 MG CAPS capsule    Sig: Take 1 capsule (0.4 mg total) by mouth daily.    Dispense:  90 capsule    Refill:  3   Medications Discontinued During This Encounter  Medication Reason  . APPLE CIDER VINEGAR PO Patient Preference  . tadalafil (CIALIS) 20 MG tablet Completed Course   Orders Placed This Encounter  Procedures  . Basic metabolic panel  . CBC with Differential/Platelet  . Hepatic function panel  . Hemoglobin A1c  . Lipid panel  . PSA, total and free  . POCT Urinalysis Dipstick (Automated)    Signed,  Roch Quach T. Corinn Stoltzfus, MD   Allergies as of 10/19/2018   No Known Allergies     Medication List       Accurate as of October 19, 2018  9:29 AM. Always use your most recent med list.        ibuprofen 400 MG tablet Commonly known as:  ADVIL,MOTRIN Take 400 mg by mouth every 6 (six) hours as needed.   multivitamin tablet Take 1 tablet by mouth daily.   tamsulosin 0.4 MG Caps capsule Commonly known as:  FLOMAX Take 1 capsule (0.4 mg total) by mouth daily.

## 2018-10-19 ENCOUNTER — Encounter: Payer: Self-pay | Admitting: Family Medicine

## 2018-10-19 ENCOUNTER — Ambulatory Visit: Payer: BLUE CROSS/BLUE SHIELD | Admitting: Family Medicine

## 2018-10-19 VITALS — BP 150/100 | HR 67 | Temp 97.5°F | Ht 72.5 in | Wt 262.5 lb

## 2018-10-19 DIAGNOSIS — R5383 Other fatigue: Secondary | ICD-10-CM | POA: Diagnosis not present

## 2018-10-19 DIAGNOSIS — N138 Other obstructive and reflux uropathy: Secondary | ICD-10-CM

## 2018-10-19 DIAGNOSIS — R350 Frequency of micturition: Secondary | ICD-10-CM | POA: Diagnosis not present

## 2018-10-19 DIAGNOSIS — Z13220 Encounter for screening for lipoid disorders: Secondary | ICD-10-CM | POA: Diagnosis not present

## 2018-10-19 DIAGNOSIS — R739 Hyperglycemia, unspecified: Secondary | ICD-10-CM

## 2018-10-19 DIAGNOSIS — N401 Enlarged prostate with lower urinary tract symptoms: Secondary | ICD-10-CM | POA: Diagnosis not present

## 2018-10-19 DIAGNOSIS — Z0000 Encounter for general adult medical examination without abnormal findings: Secondary | ICD-10-CM | POA: Diagnosis not present

## 2018-10-19 LAB — CBC WITH DIFFERENTIAL/PLATELET
Basophils Absolute: 0.1 10*3/uL (ref 0.0–0.1)
Basophils Relative: 1.3 % (ref 0.0–3.0)
EOS ABS: 0.2 10*3/uL (ref 0.0–0.7)
Eosinophils Relative: 3.2 % (ref 0.0–5.0)
HCT: 47.1 % (ref 39.0–52.0)
Hemoglobin: 16 g/dL (ref 13.0–17.0)
Lymphocytes Relative: 35.4 % (ref 12.0–46.0)
Lymphs Abs: 2.7 10*3/uL (ref 0.7–4.0)
MCHC: 34.1 g/dL (ref 30.0–36.0)
MCV: 88.1 fl (ref 78.0–100.0)
Monocytes Absolute: 0.6 10*3/uL (ref 0.1–1.0)
Monocytes Relative: 8.3 % (ref 3.0–12.0)
Neutro Abs: 4 10*3/uL (ref 1.4–7.7)
Neutrophils Relative %: 51.8 % (ref 43.0–77.0)
Platelets: 103 10*3/uL — ABNORMAL LOW (ref 150.0–400.0)
RBC: 5.34 Mil/uL (ref 4.22–5.81)
RDW: 13.5 % (ref 11.5–15.5)
WBC: 7.7 10*3/uL (ref 4.0–10.5)

## 2018-10-19 LAB — HEPATIC FUNCTION PANEL
ALT: 22 U/L (ref 0–53)
AST: 18 U/L (ref 0–37)
Albumin: 4.4 g/dL (ref 3.5–5.2)
Alkaline Phosphatase: 106 U/L (ref 39–117)
BILIRUBIN TOTAL: 0.5 mg/dL (ref 0.2–1.2)
Bilirubin, Direct: 0 mg/dL (ref 0.0–0.3)
Total Protein: 6.9 g/dL (ref 6.0–8.3)

## 2018-10-19 LAB — POC URINALSYSI DIPSTICK (AUTOMATED)
Bilirubin, UA: NEGATIVE
Glucose, UA: NEGATIVE
Ketones, UA: NEGATIVE
Leukocytes, UA: NEGATIVE
Nitrite, UA: NEGATIVE
Protein, UA: NEGATIVE
RBC UA: NEGATIVE
Spec Grav, UA: 1.015 (ref 1.010–1.025)
Urobilinogen, UA: 0.2 E.U./dL
pH, UA: 7 (ref 5.0–8.0)

## 2018-10-19 LAB — BASIC METABOLIC PANEL
BUN: 12 mg/dL (ref 6–23)
CO2: 29 mEq/L (ref 19–32)
CREATININE: 0.91 mg/dL (ref 0.40–1.50)
Calcium: 9.1 mg/dL (ref 8.4–10.5)
Chloride: 103 mEq/L (ref 96–112)
GFR: 86.87 mL/min (ref >60.00)
Glucose, Bld: 92 mg/dL (ref 70–99)
Potassium: 3.9 mEq/L (ref 3.5–5.1)
Sodium: 139 mEq/L (ref 135–145)

## 2018-10-19 LAB — LIPID PANEL
Cholesterol: 244 mg/dL — ABNORMAL HIGH (ref 0–200)
HDL: 38.5 mg/dL — ABNORMAL LOW (ref >39.00)
LDL CALC: 167 mg/dL — AB (ref 0–99)
NonHDL: 205.12
Total CHOL/HDL Ratio: 6
Triglycerides: 190 mg/dL — ABNORMAL HIGH (ref 0.0–149.0)
VLDL: 38 mg/dL (ref 0.0–40.0)

## 2018-10-19 LAB — HEMOGLOBIN A1C: Hgb A1c MFr Bld: 5.9 % (ref 4.6–6.5)

## 2018-10-19 MED ORDER — TAMSULOSIN HCL 0.4 MG PO CAPS: 0.4000 mg | ORAL_CAPSULE | Freq: Every day | ORAL | 3 refills | Status: DC

## 2018-10-19 NOTE — Patient Instructions (Signed)
Check BP once a week, above 140/90 is high

## 2018-10-20 LAB — PSA, TOTAL AND FREE
PSA, % Free: 33 % (calc) (ref >25)
PSA, Free: 0.1 ng/mL
PSA, Total: 0.3 ng/mL (ref <=4.0)

## 2018-10-21 ENCOUNTER — Encounter: Payer: Self-pay | Admitting: *Deleted

## 2018-10-23 ENCOUNTER — Telehealth: Payer: Self-pay

## 2018-10-23 NOTE — Telephone Encounter (Signed)
Pt left v/m requesting cb about 10/19/18 lab results.

## 2018-10-23 NOTE — Telephone Encounter (Signed)
Patient advised of lab results. He did not get a letter about his lab results yet.

## 2019-02-14 NOTE — Progress Notes (Signed)
Brandon Edwards T. Sweet [name], MD [facility] at [facility] [place], [address] Phone: [phone]  FAX: [phone]  Brandon Edwards - 53 y.o. male  MRN [id number]  Date of Birth: 08-08-1966  Visit Date: 02/15/2019  PCP: Brandon Loffler, MD  Referred by: Brandon Loffler, MD  Chief Complaint  Patient presents with  . Leg Swelling    Left  . Varicose Veins   Subjective:   Brandon Edwards is a 53 y.o. very pleasant male patient who presents with the following:  Left >> R lower extremity varicose. Have been there longstanding.   The left one is become quite a bit more uncomfortable, is having trouble fitting in some of his shoes and boots.  Not having any frank pain.  This is not new, and is just been progressive over the years.  He is also been having some low libido, some erectile quality issues, and he wanted to get his testosterone checked today.  Past Medical History, Surgical History, Social History, Family History, Problem List, Medications, and Allergies have been reviewed and updated if relevant.  Patient Active Problem List   Diagnosis Date Noted  . Smoker 10/30/2011  . OSA (obstructive sleep apnea) 12/06/2010  . Hyperlipidemia 11/09/2010  . FATIGUE 03/05/2010    Past Medical History:  Diagnosis Date  . BCC (basal cell carcinoma), face   . Deviated septum   . Hyperlipidemia   . Sleep apnea   . Smoker 10/30/2011    Past Surgical History:  Procedure Laterality Date  . VASECTOMY     cope  . WISDOM TOOTH EXTRACTION     age 20    Social History   Socioeconomic History  . Marital status: Married    Spouse name: Not on file  . Number of children: Y  . Years of education: Not on file  . Highest education level: Not on file  Occupational History  . 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 Financial resource strain: Not on file  .  Food insecurity    Worry: Not on file    Inability: Not on file  . Transportation needs    Medical: Not on file    Non-medical: Not on file  Tobacco Use  . Smoking status: Current Every Day Smoker    Packs/day: 1.00    Years: 30.00    Pack years: 30.00    Types: Cigarettes    Last attempt to quit: 08/24/2015    Years since quitting: 3.4  . Smokeless tobacco: Never Used  Substance and Sexual Activity  . Alcohol use: No    Alcohol/week: 0.0 standard drinks  . Drug use: No  . Sexual activity: Not on file  Lifestyle  . Physical activity    Days per week: Not on file    Minutes per session: Not on file  . Stress: Not on file  Relationships  . Social Herbalist on phone: Not on file    Gets together: Not on file    Attends religious service: Not on file    Active member of club or organization: Not on file    Attends meetings of clubs or organizations: Not on file    Relationship status: Not on file  . Intimate partner violence    Fear of current or ex partner: Not on file    Emotionally abused: Not on file  Physically abused: Not on file    Forced sexual activity: Not on file  Other Topics Concern  . Not on file  Social History Narrative  . Not on file    Family History  Problem Relation Age of Onset  . Allergies Mother   . Allergies Father   . Allergies Brother   . Allergies Brother   . Skin cancer Paternal Grandfather   . Colon cancer Neg Hx   . Esophageal cancer Neg Hx   . Pancreatic cancer Neg Hx   . Stomach cancer Neg Hx     No Known Allergies  Medication list reviewed and updated in full in [facility].   GEN: No acute illnesses, no fevers, chills. GI: No n/v/d, eating normally Pulm: No SOB Interactive and getting along well at home.  Otherwise, ROS is as per the HPI.  Objective:   BP (!) 168/92   Pulse 71   Temp 98.6 F (37 C) (Temporal)   Ht 6' 0.5" (1.842 m)   Wt 264 lb 8 oz (120 kg)   SpO2 95%   BMI 35.38 kg/m    GEN: WDWN, NAD, Non-toxic, A & O x 3 HEENT: Atraumatic, Normocephalic. Neck supple. No masses, No LAD. Ears and Nose: No external deformity. CV: RRR, No M/G/R. No JVD. No thrill. No extra heart sounds. PULM: CTA B, no wheezes, crackles, rhonchi. No retractions. No resp. distress. No accessory muscle use. EXTR: No c/c/e.  He does have notable varicose veins and spider veins in the left greater than right in the lower extremities. NEURO Normal gait.  PSYCH: Normally interactive. Conversant. Not depressed or anxious appearing.  Calm demeanor.   Laboratory and Imaging Data:  Assessment and Plan:     ICD-10-CM   1. Varicose veins of both lower extremities with pain  I83.813 Ambulatory referral to Vascular Surgery  2. Low libido  R68.82 Testosterone, Free, Total, SHBG    CANCELED: Testos,Total,Free and SHBG (Male)  3. Other fatigue  R53.83 Testosterone, Free, Total, SHBG    CANCELED: Testos,Total,Free and SHBG (Male)   Very reasonable to have discussion about varicose vein treatments with vascular surgery.  We also talked about testosterone, libido, testosterone for age, other agents such as Viagra and Cialis.  Potential risk.  If low, we will need to get a repeat a.m. testosterone fasting as well.  Follow-up: No follow-ups on file.  No orders of the defined types were placed in this encounter.  Orders Placed This Encounter  Procedures  . Testosterone, Free, Total, SHBG  . Ambulatory referral to Vascular Surgery    Signed,  Brandon Edwards. Brandon Swindler, MD   Outpatient Encounter Medications as of 02/15/2019  Medication Sig  . ibuprofen (ADVIL) 200 MG tablet Take 400 mg by mouth every 6 (six) hours as needed.  . Multiple Vitamin (MULTIVITAMIN) tablet Take 1 tablet by mouth daily.  . tamsulosin (FLOMAX) 0.4 MG CAPS capsule Take 1 capsule (0.4 mg total) by mouth daily.  . [DISCONTINUED] ibuprofen (ADVIL,MOTRIN) 400 MG tablet Take 400 mg by mouth every 6 (six) hours as needed.   No  facility-administered encounter medications on file as of 02/15/2019.

## 2019-02-15 ENCOUNTER — Other Ambulatory Visit: Payer: Self-pay

## 2019-02-15 ENCOUNTER — Ambulatory Visit: Payer: BC Managed Care – PPO | Admitting: Family Medicine

## 2019-02-15 ENCOUNTER — Encounter: Payer: Self-pay | Admitting: Family Medicine

## 2019-02-15 VITALS — BP 168/92 | HR 71 | Temp 98.6°F | Ht 72.5 in | Wt 264.5 lb

## 2019-02-15 DIAGNOSIS — I83813 Varicose veins of bilateral lower extremities with pain: Secondary | ICD-10-CM

## 2019-02-15 DIAGNOSIS — R5383 Other fatigue: Secondary | ICD-10-CM

## 2019-02-15 DIAGNOSIS — R6882 Decreased libido: Secondary | ICD-10-CM

## 2019-02-15 NOTE — Addendum Note (Signed)
Addended by: Ellamae Sia on: 02/15/2019 10:29 AM   Modules accepted: Orders

## 2019-02-19 LAB — TESTOSTERONE,FREE AND TOTAL
Testosterone, Free: 5.4 pg/mL — ABNORMAL LOW (ref 7.2–24.0)
Testosterone: 155 ng/dL — ABNORMAL LOW (ref 264–916)

## 2019-02-21 ENCOUNTER — Other Ambulatory Visit: Payer: Self-pay | Admitting: Family Medicine

## 2019-02-21 DIAGNOSIS — R6882 Decreased libido: Secondary | ICD-10-CM

## 2019-02-21 DIAGNOSIS — R5383 Other fatigue: Secondary | ICD-10-CM

## 2019-02-21 NOTE — Progress Notes (Signed)
lab

## 2019-03-11 ENCOUNTER — Encounter (INDEPENDENT_AMBULATORY_CARE_PROVIDER_SITE_OTHER): Payer: Self-pay | Admitting: Vascular Surgery

## 2019-03-11 ENCOUNTER — Ambulatory Visit (INDEPENDENT_AMBULATORY_CARE_PROVIDER_SITE_OTHER): Payer: BC Managed Care – PPO | Admitting: Vascular Surgery

## 2019-03-11 ENCOUNTER — Other Ambulatory Visit: Payer: Self-pay

## 2019-03-11 VITALS — BP 191/117 | HR 73 | Resp 12 | Ht 73.0 in | Wt 273.0 lb

## 2019-03-11 DIAGNOSIS — E782 Mixed hyperlipidemia: Secondary | ICD-10-CM | POA: Diagnosis not present

## 2019-03-11 DIAGNOSIS — G4733 Obstructive sleep apnea (adult) (pediatric): Secondary | ICD-10-CM

## 2019-03-11 DIAGNOSIS — I872 Venous insufficiency (chronic) (peripheral): Secondary | ICD-10-CM

## 2019-03-11 DIAGNOSIS — I8312 Varicose veins of left lower extremity with inflammation: Secondary | ICD-10-CM

## 2019-03-11 DIAGNOSIS — I8311 Varicose veins of right lower extremity with inflammation: Secondary | ICD-10-CM | POA: Insufficient documentation

## 2019-03-11 NOTE — Progress Notes (Signed)
MRN : [id number]  Brandon Edwards is a 53 y.o. (09/22/1965) male who presents with chief complaint of  Chief Complaint  Patient presents with  . New Patient (Initial Visit)  .  History of Present Illness:   The patient is seen for evaluation of varicose veins, he notes the left leg is more affected than the right. The patient relates mild symptoms some burning and itching. The patient denies aching and throbbing pain over the varicosities, notes this is not effected by prolonged dependent positions. The patient states the varicose veins do not interfere with work, daily exercise, shopping and household maintenance. At this point, the symptoms are not having a negative impact on lifestyle and are interfering with daily activities.  There is no history of DVT, PE or superficial thrombophlebitis. There is no history of ulceration or hemorrhage. The patient denies a significant family history of varicose veins.  The patient has worn medium graduated compression in the past. At the present time the patient has not been using over-the-counter analgesics. There is no history of prior surgical intervention or sclerotherapy.    Current Meds  Medication Sig  . Multiple Vitamin (MULTIVITAMIN) tablet Take 1 tablet by mouth daily.  . tamsulosin (FLOMAX) 0.4 MG CAPS capsule Take 1 capsule (0.4 mg total) by mouth daily.    Past Medical History:  Diagnosis Date  . BCC (basal cell carcinoma), face   . Deviated septum   . Hyperlipidemia   . Sleep apnea   . Smoker 10/30/2011    Past Surgical History:  Procedure Laterality Date  . VASECTOMY     cope  . WISDOM TOOTH EXTRACTION     age 42    Social History Social History   Tobacco Use  . Smoking status: Current Every Day Smoker    Packs/day: 1.00    Years: 30.00    Pack years: 30.00    Types: Cigarettes    Last attempt to quit: 08/24/2015    Years since quitting: 3.5  . Smokeless tobacco: Never Used  Substance Use Topics  .  Alcohol use: No    Alcohol/week: 0.0 standard drinks  . Drug use: No    Family History Family History  Problem Relation Age of Onset  . Allergies Mother   . Allergies Father   . Allergies Brother   . Allergies Brother   . Skin cancer Paternal Grandfather   . Colon cancer Neg Hx   . Esophageal cancer Neg Hx   . Pancreatic cancer Neg Hx   . Stomach cancer Neg Hx   No family history of bleeding/clotting disorders, porphyria or autoimmune disease   No Known Allergies   REVIEW OF SYSTEMS (Negative unless checked)  Constitutional: [] Weight loss  [] Fever  [] Chills Cardiac: [] Chest pain   [] Chest pressure   [] Palpitations   [] Shortness of breath when laying flat   [] Shortness of breath with exertion. Vascular:  [] Pain in legs with walking   [] Pain in legs at rest  [] History of DVT   [] Phlebitis   [x] Swelling in legs   [x] Varicose veins   [] Non-healing ulcers Pulmonary:   [] Uses home oxygen   [] Productive cough   [] Hemoptysis   [] Wheeze  [] COPD   [] Asthma Neurologic:  [] Dizziness   [] Seizures   [] History of stroke   [] History of TIA  [] Aphasia   [] Vissual changes   [] Weakness or numbness in arm   [] Weakness or numbness in leg Musculoskeletal:   [] Joint swelling   [] Joint pain   []   Low back pain Hematologic:  [] Easy bruising  [] Easy bleeding   [] Hypercoagulable state   [] Anemic Gastrointestinal:  [] Diarrhea   [] Vomiting  [] Gastroesophageal reflux/heartburn   [] Difficulty swallowing. Genitourinary:  [] Chronic kidney disease   [] Difficult urination  [] Frequent urination   [] Blood in urine Skin:  [] Rashes   [] Ulcers  Psychological:  [] History of anxiety   []  History of major depression.  Physical Examination  Vitals:   03/11/19 0823  BP: (!) 191/117  Pulse: 73  Resp: 12  Weight: 273 lb (123.8 kg)  Height: 6\' 1"  (1.854 m)   Body mass index is 36.02 kg/m. Gen: WD/WN, NAD Head: [place]/AT, No temporalis wasting.  Ear/Nose/Throat: Hearing grossly intact, nares w/o erythema or drainage,  poor dentition Eyes: PER, EOMI, sclera nonicteric.  Neck: Supple, no masses.  No bruit or JVD.  Pulmonary:  Good air movement, clear to auscultation bilaterally, no use of accessory muscles.  Cardiac: RRR, normal S1, S2, no Murmurs. Vascular: scattered varicosities present bilaterally.  Mild venous stasis changes to the legs bilaterally.  2+ soft pitting edema left much more so than the right Vessel Right Left  Radial Palpable Palpable  PT Palpable Palpable  DP Palpable Palpable  Gastrointestinal: soft, non-distended. No guarding/no peritoneal signs.  Musculoskeletal: M/S 5/5 throughout.  No deformity or atrophy.  Neurologic: CN 2-12 intact. Pain and light touch intact in extremities.  Symmetrical.  Speech is fluent. Motor exam as listed above. Psychiatric: Judgment intact, Mood & affect appropriate for pt's clinical situation. Dermatologic: very mild venous rashes no ulcers noted.  No changes consistent with cellulitis. Lymph : No Cervical lymphadenopathy, no lichenification or skin changes of chronic lymphedema.  CBC Lab Results  Component Value Date   WBC 7.7 10/19/2018   HGB 16.0 10/19/2018   HCT 47.1 10/19/2018   MCV 88.1 10/19/2018   PLT 103.0 (L) 10/19/2018    BMET    Component Value Date/Time   NA 139 10/19/2018 0951   NA 140 03/24/2017 0936   K 3.9 10/19/2018 0951   CL 103 10/19/2018 0951   CO2 29 10/19/2018 0951   GLUCOSE 92 10/19/2018 0951   BUN 12 10/19/2018 0951   BUN 12 03/24/2017 0936   CREATININE 0.91 10/19/2018 0951   CALCIUM 9.1 10/19/2018 0951   GFRNONAA 91 03/24/2017 0936   GFRAA 105 03/24/2017 0936   CrCl cannot be calculated (Patient's most recent lab result is older than the maximum 21 days allowed.).  COAG No results found for: INR, PROTIME  Radiology No results found.    Assessment/Plan 1. Varicose veins of both lower extremities with inflammation Recommend:  The patient is complaining of varicose veins.    I have had a long  discussion with the patient regarding  varicose veins and why they cause symptoms.  Patient will begin wearing graduated compression stockings on a daily basis, beginning first thing in the morning and removing them in the evening. The patient is instructed specifically not to sleep in the stockings.    The patient  will also begin using over-the-counter analgesics such as Motrin 600 mg po TID to help control the symptoms as needed.    In addition, behavioral modification including elevation during the day will be initiated, utilizing a recliner was recommended.  The patient is also instructed to continue exercising such as walking 4-5 times per week.  At this time the patient wishes to continue conservative therapy and is not interested in more invasive treatments such as laser ablation and sclerotherapy.  The  Patient will follow up PRN if the symptoms worsen.  2. Chronic venous insufficiency Recommend:  The patient is complaining of varicose veins.    I have had a long discussion with the patient regarding  varicose veins and why they cause symptoms.  Patient will begin wearing graduated compression stockings on a daily basis, beginning first thing in the morning and removing them in the evening. The patient is instructed specifically not to sleep in the stockings.    The patient  will also begin using over-the-counter analgesics such as Motrin 600 mg po TID to help control the symptoms as needed.    In addition, behavioral modification including elevation during the day will be initiated, utilizing a recliner was recommended.  The patient is also instructed to continue exercising such as walking 4-5 times per week.  At this time the patient wishes to continue conservative therapy and is not interested in more invasive treatments such as laser ablation and sclerotherapy.  The Patient will follow up PRN if the symptoms worsen.  3. Mixed hyperlipidemia Continue statin as ordered and  reviewed, no changes at this time   4. OSA (obstructive sleep apnea) Continue pulmonary medications and aerosols as already ordered, these medications have been reviewed and there are no changes at this time.  Patient is strongly encouraged to wear his CPAP nightly to avoid increased pulmonary pressures resulting in increased bilateral lower extremity lymphedema.      Hortencia Pilar, MD  03/11/2019 9:25 AM

## 2019-03-18 ENCOUNTER — Other Ambulatory Visit: Payer: BC Managed Care – PPO

## 2019-03-19 ENCOUNTER — Other Ambulatory Visit: Payer: Self-pay

## 2019-03-19 ENCOUNTER — Other Ambulatory Visit (INDEPENDENT_AMBULATORY_CARE_PROVIDER_SITE_OTHER): Payer: BC Managed Care – PPO

## 2019-03-19 DIAGNOSIS — R6882 Decreased libido: Secondary | ICD-10-CM | POA: Diagnosis not present

## 2019-03-19 DIAGNOSIS — R5383 Other fatigue: Secondary | ICD-10-CM | POA: Diagnosis not present

## 2019-03-19 NOTE — Addendum Note (Signed)
Addended by: Ellamae Sia on: 03/19/2019 09:25 AM   Modules accepted: Orders

## 2019-03-19 NOTE — Addendum Note (Signed)
Addended by: Ellamae Sia on: 03/19/2019 08:23 AM   Modules accepted: Orders

## 2019-03-23 LAB — TESTOSTERONE,FREE AND TOTAL
Testosterone, Free: 6.2 pg/mL — ABNORMAL LOW (ref 7.2–24.0)
Testosterone: 273 ng/dL (ref 264–916)

## 2019-04-19 ENCOUNTER — Other Ambulatory Visit: Payer: Self-pay

## 2019-04-19 ENCOUNTER — Emergency Department (HOSPITAL_COMMUNITY)
Admission: EM | Admit: 2019-04-19 | Discharge: 2019-04-20 | Disposition: A | Discharge status: Home / Self Care | Payer: BC Managed Care – PPO | Attending: Emergency Medicine | Admitting: Emergency Medicine

## 2019-04-19 ENCOUNTER — Encounter (HOSPITAL_COMMUNITY): Payer: Self-pay | Admitting: Emergency Medicine

## 2019-04-19 ENCOUNTER — Emergency Department (HOSPITAL_COMMUNITY): Payer: BC Managed Care – PPO

## 2019-04-19 DIAGNOSIS — R0989 Other specified symptoms and signs involving the circulatory and respiratory systems: Secondary | ICD-10-CM | POA: Diagnosis present

## 2019-04-19 DIAGNOSIS — Z79899 Other long term (current) drug therapy: Secondary | ICD-10-CM | POA: Diagnosis not present

## 2019-04-19 DIAGNOSIS — Z20822 Contact with and (suspected) exposure to covid-19: Secondary | ICD-10-CM

## 2019-04-19 DIAGNOSIS — J069 Acute upper respiratory infection, unspecified: Secondary | ICD-10-CM

## 2019-04-19 DIAGNOSIS — R0602 Shortness of breath: Secondary | ICD-10-CM | POA: Diagnosis not present

## 2019-04-19 DIAGNOSIS — F17210 Nicotine dependence, cigarettes, uncomplicated: Secondary | ICD-10-CM | POA: Insufficient documentation

## 2019-04-19 DIAGNOSIS — B9789 Other viral agents as the cause of diseases classified elsewhere: Secondary | ICD-10-CM | POA: Diagnosis not present

## 2019-04-19 DIAGNOSIS — R05 Cough: Secondary | ICD-10-CM | POA: Insufficient documentation

## 2019-04-19 DIAGNOSIS — Z20828 Contact with and (suspected) exposure to other viral communicable diseases: Secondary | ICD-10-CM | POA: Diagnosis not present

## 2019-04-19 LAB — CBC WITH DIFFERENTIAL/PLATELET
Abs Immature Granulocytes: 0.03 10*3/uL (ref 0.00–0.07)
Basophils Absolute: 0.1 10*3/uL (ref 0.0–0.1)
Basophils Relative: 2 %
Eosinophils Absolute: 0.4 10*3/uL (ref 0.0–0.5)
Eosinophils Relative: 4 %
HCT: 47.5 % (ref 39.0–52.0)
Hemoglobin: 15.8 g/dL (ref 13.0–17.0)
Immature Granulocytes: 0 %
Lymphocytes Relative: 31 %
Lymphs Abs: 2.9 10*3/uL (ref 0.7–4.0)
MCH: 30.1 pg (ref 26.0–34.0)
MCHC: 33.3 g/dL (ref 30.0–36.0)
MCV: 90.5 fL (ref 80.0–100.0)
Monocytes Absolute: 0.7 10*3/uL (ref 0.1–1.0)
Monocytes Relative: 8 %
Neutro Abs: 5.3 10*3/uL (ref 1.7–7.7)
Neutrophils Relative %: 55 %
Platelets: 134 10*3/uL — ABNORMAL LOW (ref 150–400)
RBC: 5.25 MIL/uL (ref 4.22–5.81)
RDW: 13.1 % (ref 11.5–15.5)
WBC: 9.5 10*3/uL (ref 4.0–10.5)
nRBC: 0.2 % (ref 0.0–0.2)

## 2019-04-19 LAB — PROTIME-INR
INR: 0.9 (ref 0.8–1.2)
Prothrombin Time: 12 seconds (ref 11.4–15.2)

## 2019-04-19 LAB — COMPREHENSIVE METABOLIC PANEL
ALT: 27 U/L (ref 0–44)
AST: 21 U/L (ref 15–41)
Albumin: 4 g/dL (ref 3.5–5.0)
Alkaline Phosphatase: 100 U/L (ref 38–126)
Anion gap: 10 (ref 5–15)
BUN: 9 mg/dL (ref 6–20)
CO2: 24 mmol/L (ref 22–32)
Calcium: 9.2 mg/dL (ref 8.9–10.3)
Chloride: 107 mmol/L (ref 98–111)
Creatinine, Ser: 0.99 mg/dL (ref 0.61–1.24)
GFR calc Af Amer: >60 mL/min (ref >60)
GFR calc non Af Amer: >60 mL/min (ref >60)
Glucose, Bld: 113 mg/dL — ABNORMAL HIGH (ref 70–99)
Potassium: 3.5 mmol/L (ref 3.5–5.1)
Sodium: 141 mmol/L (ref 135–145)
Total Bilirubin: 0.6 mg/dL (ref 0.3–1.2)
Total Protein: 6.9 g/dL (ref 6.5–8.1)

## 2019-04-19 MED ORDER — SODIUM CHLORIDE 0.9 % IV BOLUS
1000.0000 mL | Freq: Once | INTRAVENOUS | Status: AC
  Administered 2019-04-19: 22:00:00 1000 mL via INTRAVENOUS

## 2019-04-19 MED ORDER — IOHEXOL 350 MG/ML SOLN
75.0000 mL | Freq: Once | INTRAVENOUS | Status: AC | PRN
  Administered 2019-04-19: 75 mL via INTRAVENOUS

## 2019-04-19 NOTE — ED Triage Notes (Signed)
Patient reports productive cough with bloody phlegm onset today , no SOB , hypertensive at triage - no medications for hypertension .

## 2019-04-19 NOTE — ED Provider Notes (Signed)
[facility] EMERGENCY DEPARTMENT Provider Note   CSN: [id number] Arrival date & time: 04/19/19  2033     History   Chief Complaint Chief Complaint  Patient presents with  . Cough  . Hypertension    HPI Brandon Edwards is a 53 y.o. male.     Patient is a 53 year old male with a past medical history of obstructive sleep apnea, hyperlipidemia, and tobacco use.  He presents today for evaluation of chest congestion and cough.  This is been ongoing for the past several days.  He describes coughing up bloody sputum today.  He denies fevers or chills.  The history is provided by the patient.  Cough Cough characteristics:  Productive Sputum characteristics:  Bloody Severity:  Moderate Onset quality:  Gradual Duration:  3 days Timing:  Intermittent Progression:  Worsening Chronicity:  New Hypertension    Past Medical History:  Diagnosis Date  . BCC (basal cell carcinoma), face   . Deviated septum   . Hyperlipidemia   . Sleep apnea   . Smoker 10/30/2011    Patient Active Problem List   Diagnosis Date Noted  . Varicose veins of both lower extremities with inflammation 03/11/2019  . Chronic venous insufficiency 03/11/2019  . Smoker 10/30/2011  . OSA (obstructive sleep apnea) 12/06/2010  . Hyperlipidemia 11/09/2010  . FATIGUE 03/05/2010    Past Surgical History:  Procedure Laterality Date  . VASECTOMY     cope  . WISDOM TOOTH EXTRACTION     age 50        Home Medications    Prior to Admission medications   Medication Sig Start Date End Date Taking? Authorizing Provider  ibuprofen (ADVIL) 200 MG tablet Take 400 mg by mouth every 6 (six) hours as needed.    [provider]  Multiple Vitamin (MULTIVITAMIN) tablet Take 1 tablet by mouth daily.    [provider]  tamsulosin (FLOMAX) 0.4 MG CAPS capsule Take 1 capsule (0.4 mg total) by mouth daily. 10/19/18   Owens Loffler, MD    Family History Family History  Problem  Relation Age of Onset  . Allergies Mother   . Allergies Father   . Allergies Brother   . Allergies Brother   . Skin cancer Paternal Grandfather   . Colon cancer Neg Hx   . Esophageal cancer Neg Hx   . Pancreatic cancer Neg Hx   . Stomach cancer Neg Hx     Social History Social History   Tobacco Use  . Smoking status: Current Every Day Smoker    Packs/day: 1.00    Years: 30.00    Pack years: 30.00    Types: Cigarettes    Last attempt to quit: 08/24/2015    Years since quitting: 3.6  . Smokeless tobacco: Never Used  Substance Use Topics  . Alcohol use: No    Alcohol/week: 0.0 standard drinks  . Drug use: No     Allergies   Patient has no known allergies.   Review of Systems Review of Systems  Respiratory: Positive for cough.   All other systems reviewed and are negative.    Physical Exam Updated Vital Signs BP (!) 200/108 (BP Location: Right Arm)   Pulse 86   Temp 98.1 F (36.7 C) (Oral)   Resp (!) 22   SpO2 95%   Physical Exam Vitals signs and nursing note reviewed.  Constitutional:      General: He is not in acute distress.    Appearance: He is  well-developed. He is not diaphoretic.  HENT:     Head: Normocephalic and atraumatic.  Neck:     Musculoskeletal: Normal range of motion and neck supple.  Cardiovascular:     Rate and Rhythm: Normal rate and regular rhythm.     Heart sounds: No murmur. No friction rub.  Pulmonary:     Effort: Pulmonary effort is normal. No respiratory distress.     Breath sounds: Normal breath sounds. No wheezing or rales.  Abdominal:     General: Bowel sounds are normal. There is no distension.     Palpations: Abdomen is soft.     Tenderness: There is no abdominal tenderness.  Musculoskeletal: Normal range of motion.        General: No swelling or tenderness.     Right lower leg: No edema.     Left lower leg: No edema.     Comments: Homans sign is absent bilaterally.  Skin:    General: Skin is warm and dry.   Neurological:     Mental Status: He is alert and oriented to person, place, and time.     Coordination: Coordination normal.      ED Treatments / Results  Labs (all labs ordered are listed, but only abnormal results are displayed) Labs Reviewed  CBC WITH DIFFERENTIAL/PLATELET - Abnormal; Notable for the following components:      Result Value   Platelets 134 (*)    All other components within normal limits  COMPREHENSIVE METABOLIC PANEL - Abnormal; Notable for the following components:   Glucose, Bld 113 (*)    All other components within normal limits  NOVEL CORONAVIRUS, NAA (HOSP ORDER, SEND-OUT TO REF LAB; TAT 18-24 HRS)  PROTIME-INR    EKG None  Radiology Dg Chest 2 View  Result Date: 04/19/2019 CLINICAL DATA:  Initial evaluation for acute productive cough. EXAM: CHEST - 2 VIEW COMPARISON:  None available. FINDINGS: Cardiac and mediastinal silhouettes are within normal limits. Lungs normally inflated. Mild scattered peribronchial thickening, which could reflect sequelae of acute bronchiolitis given history of cough. No consolidative opacity to suggest bronchopneumonia. No pulmonary edema or pleural effusion. No pneumothorax. No acute osseous finding. IMPRESSION: Mild scattered peribronchial thickening, which could reflect sequelae of acute bronchiolitis given history of cough. No consolidative opacity to suggest bronchopneumonia. Electronically Signed   By: Jeannine Boga M.D.   On: 04/19/2019 21:08    Procedures Procedures (including critical care time)  Medications Ordered in ED Medications - No data to display   Initial Impression / Assessment and Plan / ED Course  I have reviewed the triage vital signs and the nursing notes.  Pertinent labs & imaging results that were available during my care of the patient were reviewed by me and considered in my medical decision making (see chart for details).  Patient presenting with a several day history of cough.  His  cough became productive today with occasionally bloody sputum.  Laboratory studies are essentially unremarkable and chest x-ray shows peribronchial thickening consistent with acute bronchiolitis.  Due to the patient's hemoptysis, I feel as though a CT scan to rule out pulmonary embolism is indicated.  Patient also has a history of smoking a pack a day for the past 40 years and the possibility of neoplasm also needs to be addressed.  A CT scan will be obtained to evaluate for both of these conditions and care will be signed out to Dr. Kathrynn Humble at shift change.  He will obtain the results of the CT  scan and determine the final disposition.  Final Clinical Impressions(s) / ED Diagnoses   Final diagnoses:  None    ED Discharge Orders    None       Veryl Speak, MD 04/20/19 [phone]

## 2019-04-19 NOTE — ED Provider Notes (Signed)
  Physical Exam  BP (!) 200/108 (BP Location: Right Arm)   Pulse 86   Temp 98.1 F (36.7 C) (Oral)   Resp (!) 22   SpO2 95%   Physical Exam  ED Course/Procedures     Procedures  MDM    Assuming care of patient from Dr. Stark Jock.   Patient in the ED for cough/dib/hemoptysis. Workup thus far shows no acute changes.  Concerning findings are as following: hemoptysis. Important pending results are - none.  According to Dr. Stark Jock, plan is to f/u CT scan to eval for PE.  Anticipate d/c.   Patient had no complains, no concerns from the nursing side. Will continue to monitor.   Varney Biles, MD 04/19/19 [phone]

## 2019-04-20 ENCOUNTER — Emergency Department (HOSPITAL_COMMUNITY): Payer: BC Managed Care – PPO

## 2019-04-20 DIAGNOSIS — R0602 Shortness of breath: Secondary | ICD-10-CM | POA: Diagnosis not present

## 2019-04-20 MED ORDER — ALBUTEROL SULFATE HFA 108 (90 BASE) MCG/ACT IN AERS: 2.0000 | INHALATION_SPRAY | RESPIRATORY_TRACT | 0 refills | Status: DC | PRN

## 2019-04-20 MED ORDER — PREDNISONE 10 MG PO TABS: 50.0000 mg | ORAL_TABLET | Freq: Every day | ORAL | 0 refills | Status: DC

## 2019-04-20 NOTE — Discharge Instructions (Signed)
We saw you in the ER for cough. CT scan is concerning for viral infection or bronchitis. Covid-19 is a possible cause, and you have been tested.  Please quarantine yourself until 3 days of feeling completely well or for 14 days, even if the test is negative.

## 2019-04-20 NOTE — ED Notes (Signed)
Patient verbalizes understanding of discharge instructions. Opportunity for questioning and answers were provided. Armband removed by staff, pt discharged from ED.  

## 2019-04-21 LAB — NOVEL CORONAVIRUS, NAA (HOSP ORDER, SEND-OUT TO REF LAB; TAT 18-24 HRS): SARS-CoV-2, NAA: NOT DETECTED

## 2020-03-20 ENCOUNTER — Telehealth: Payer: Self-pay | Admitting: Family Medicine

## 2020-03-20 MED ORDER — TADALAFIL 20 MG PO TABS: 10.0000 mg | ORAL_TABLET | ORAL | 11 refills | Status: DC | PRN

## 2020-03-20 NOTE — Telephone Encounter (Signed)
Pt needs a refill on cialis.  He is completely out. He is scheduled for a cpe on 8/25. Last office visit 02/15/19.

## 2020-03-30 ENCOUNTER — Other Ambulatory Visit (INDEPENDENT_AMBULATORY_CARE_PROVIDER_SITE_OTHER): Payer: Commercial Managed Care - PPO

## 2020-03-30 ENCOUNTER — Other Ambulatory Visit: Payer: Self-pay

## 2020-03-30 DIAGNOSIS — Z0000 Encounter for general adult medical examination without abnormal findings: Secondary | ICD-10-CM

## 2020-03-30 DIAGNOSIS — Z131 Encounter for screening for diabetes mellitus: Secondary | ICD-10-CM

## 2020-03-31 ENCOUNTER — Other Ambulatory Visit: Payer: BC Managed Care – PPO

## 2020-03-31 LAB — CBC WITH DIFFERENTIAL/PLATELET
Basophils Absolute: 0.1 10*3/uL (ref 0.0–0.2)
Basos: 2 %
EOS (ABSOLUTE): 0.3 10*3/uL (ref 0.0–0.4)
Eos: 3 %
Hematocrit: 46.2 % (ref 37.5–51.0)
Hemoglobin: 15.7 g/dL (ref 13.0–17.7)
Immature Grans (Abs): 0 10*3/uL (ref 0.0–0.1)
Immature Granulocytes: 0 %
Lymphocytes Absolute: 2.6 10*3/uL (ref 0.7–3.1)
Lymphs: 31 %
MCH: 30.1 pg (ref 26.6–33.0)
MCHC: 34 g/dL (ref 31.5–35.7)
MCV: 89 fL (ref 79–97)
Monocytes Absolute: 0.7 10*3/uL (ref 0.1–0.9)
Monocytes: 8 %
Neutrophils Absolute: 4.5 10*3/uL (ref 1.4–7.0)
Neutrophils: 56 %
Platelets: 114 10*3/uL — ABNORMAL LOW (ref 150–450)
RBC: 5.21 x10E6/uL (ref 4.14–5.80)
RDW: 13.1 % (ref 11.6–15.4)
WBC: 8.1 10*3/uL (ref 3.4–10.8)

## 2020-03-31 LAB — LIPID PANEL
Chol/HDL Ratio: 6.3 ratio — ABNORMAL HIGH (ref 0.0–5.0)
Cholesterol, Total: 240 mg/dL — ABNORMAL HIGH (ref 100–199)
HDL: 38 mg/dL — ABNORMAL LOW (ref >39)
LDL Chol Calc (NIH): 170 mg/dL — ABNORMAL HIGH (ref 0–99)
Triglycerides: 173 mg/dL — ABNORMAL HIGH (ref 0–149)
VLDL Cholesterol Cal: 32 mg/dL (ref 5–40)

## 2020-03-31 LAB — HEPATIC FUNCTION PANEL
ALT: 31 IU/L (ref 0–44)
AST: 20 IU/L (ref 0–40)
Albumin: 4.4 g/dL (ref 3.8–4.9)
Alkaline Phosphatase: 117 IU/L (ref 48–121)
Bilirubin Total: 0.3 mg/dL (ref 0.0–1.2)
Bilirubin, Direct: 0.09 mg/dL (ref 0.00–0.40)
Total Protein: 6.7 g/dL (ref 6.0–8.5)

## 2020-03-31 LAB — BASIC METABOLIC PANEL
BUN/Creatinine Ratio: 13 (ref 9–20)
BUN: 11 mg/dL (ref 6–24)
CO2: 26 mmol/L (ref 20–29)
Calcium: 8.9 mg/dL (ref 8.7–10.2)
Chloride: 103 mmol/L (ref 96–106)
Creatinine, Ser: 0.86 mg/dL (ref 0.76–1.27)
GFR calc Af Amer: 113 mL/min/{1.73_m2} (ref >59)
GFR calc non Af Amer: 98 mL/min/{1.73_m2} (ref >59)
Glucose: 118 mg/dL — ABNORMAL HIGH (ref 65–99)
Potassium: 4.2 mmol/L (ref 3.5–5.2)
Sodium: 139 mmol/L (ref 134–144)

## 2020-03-31 LAB — HEMOGLOBIN A1C
Est. average glucose Bld gHb Est-mCnc: 123 mg/dL
Hgb A1c MFr Bld: 5.9 % — ABNORMAL HIGH (ref 4.8–5.6)

## 2020-03-31 LAB — PSA: Prostate Specific Ag, Serum: 0.4 ng/mL (ref 0.0–4.0)

## 2020-04-04 NOTE — Progress Notes (Signed)
Earla Charlie T. Breeze Angell, MD, [place] at [facility] [place], [address]  Phone: [phone]  FAX: [place] - 54 y.o. male  MRN [id number]  Date of Birth: Jul 18, 1966  Date: 04/05/2020  PCP: Owens Loffler, MD  Referral: Owens Loffler, MD  Chief Complaint  Patient presents with  . Annual Exam    This visit occurred during the SARS-CoV-2 public health emergency.  Safety protocols were in place, including screening questions prior to the visit, additional usage of staff PPE, and extensive cleaning of exam room while observing appropriate contact time as indicated for disinfecting solutions.   Patient Care Team: Owens Loffler, MD as PCP - General Subjective:   Brandon Edwards is a 54 y.o. pleasant patient who presents with the following:  Preventative Health Maintenance Visit:  Health Maintenance Summary Reviewed and updated, unless pt declines services.  Tobacco History Reviewed. 1 PPD Alcohol: No concerns, no excessive use Exercise Habits: Minimal activity rec at least 30 mins 5 times a week STD concerns: no risk or activity to increase risk Drug Use: None  Health maintenance: Check on COVID-19 vaccine He does need a repeat tetanus shot/Tdap, check on status  Overall, he is relatively healthy he does use some Cialis intermittently with some good effect.  BP and chol are high  BP Readings from Last 3 Encounters:  04/05/20 (!) 158/86  04/20/19 (!) 146/92  03/11/19 (!) 191/117    Wt Readings from Last 3 Encounters:  04/05/20 271 lb (122.9 kg)  03/11/19 273 lb (123.8 kg)  02/15/19 264 lb 8 oz (120 kg)    The 10-year ASCVD risk score Mikey Bussing DC Jr., et al., 2013) is: 22.3%   Values used to calculate the score:     Age: 54 years     Sex: Male     Is Non-Hispanic African American: No     Diabetic: No     Tobacco smoker: Yes     Systolic  Blood Pressure: 158 mmHg     Is BP treated: No     HDL Cholesterol: 38 mg/dL     Total Cholesterol: 240 mg/dL   - does not want to take meds    Health Maintenance  Topic Date Due  . Hepatitis C Screening  Never done  . COVID-19 Vaccine (1) Never done  . HIV Screening  Never done  . INFLUENZA VACCINE  03/12/2020  . COLONOSCOPY  06/23/2022  . TETANUS/TDAP  04/05/2030   Immunization History  Administered Date(s) Administered  . Td 04/05/2020  . Tdap 03/08/2010   Patient Active Problem List   Diagnosis Date Noted  . Hyperlipidemia 11/09/2010    Priority: Medium  . Varicose veins of both lower extremities with inflammation 03/11/2019  . Smoker 10/30/2011  . OSA (obstructive sleep apnea) 12/06/2010  . Personal history of malignant neoplasm of skin 11/12/2010    Past Medical History:  Diagnosis Date  . BCC (basal cell carcinoma), face   . Deviated septum   . Hyperlipidemia   . Sleep apnea   . Smoker 10/30/2011    Past Surgical History:  Procedure Laterality Date  . VASECTOMY     cope  . WISDOM TOOTH EXTRACTION     age 54    Family History  Problem Relation Age of Onset  . Allergies Mother   . Allergies Father   . Allergies Brother   . Allergies Brother   .  Skin cancer Paternal Grandfather   . Colon cancer Neg Hx   . Esophageal cancer Neg Hx   . Pancreatic cancer Neg Hx   . Stomach cancer Neg Hx     Past Medical History, Surgical History, Social History, Family History, Problem List, Medications, and Allergies have been reviewed and updated if relevant.  Review of Systems: Pertinent positives are listed above.  Otherwise, a full 14 point review of systems has been done in full and it is negative except where it is noted positive.  Objective:   BP (!) 158/86   Pulse 79   Temp 97.6 F (36.4 C) (Temporal)   Ht 6\' 1"  (1.854 m)   Wt 271 lb (122.9 kg)   SpO2 95%   BMI 35.75 kg/m  Ideal Body Weight: Weight in (lb) to have BMI = 25: 189.1  Ideal Body  Weight: Weight in (lb) to have BMI = 25: 189.1 No exam data present Depression screen [facility] 2/9 04/05/2020 02/15/2019  Decreased Interest 0 0  Down, Depressed, Hopeless 0 0  PHQ - 2 Score 0 0     GEN: well developed, well nourished, no acute distress Eyes: conjunctiva and lids normal, PERRLA, EOMI ENT: TM clear, nares clear, oral exam WNL Neck: supple, no lymphadenopathy, no thyromegaly, no JVD Pulm: clear to auscultation and percussion, respiratory effort normal CV: regular rate and rhythm, S1-S2, no murmur, rub or gallop, no bruits, peripheral pulses normal and symmetric, no cyanosis, clubbing, edema or varicosities GI: soft, non-tender; no hepatosplenomegaly, masses; active bowel sounds all quadrants GU: no hernia, testicular mass, penile discharge Lymph: no cervical, axillary or inguinal adenopathy MSK: gait normal, muscle tone and strength WNL, no joint swelling, effusions, discoloration, crepitus  SKIN: clear, good turgor, color WNL, no rashes, lesions, or ulcerations Neuro: normal mental status, normal strength, sensation, and motion Psych: alert; oriented to person, place and time, normally interactive and not anxious or depressed in appearance.  All labs reviewed with patient. Results for orders placed or performed in visit on 03/30/20  Lipid panel  Result Value Ref Range   Cholesterol, Total 240 (H) 100 - 199 mg/dL   Triglycerides 173 (H) 0 - 149 mg/dL   HDL 38 (L) >39 mg/dL   VLDL Cholesterol Cal 32 5 - 40 mg/dL   LDL Chol Calc (NIH) 170 (H) 0 - 99 mg/dL   Chol/HDL Ratio 6.3 (H) 0.0 - 5.0 ratio  CBC with Differential/Platelet  Result Value Ref Range   WBC 8.1 3.4 - 10.8 x10E3/uL   RBC 5.21 4.14 - 5.80 x10E6/uL   Hemoglobin 15.7 13.0 - 17.7 g/dL   Hematocrit 46.2 37.5 - 51.0 %   MCV 89 79 - 97 fL   MCH 30.1 26.6 - 33.0 pg   MCHC 34.0 31 - 35 g/dL   RDW 13.1 11.6 - 15.4 %   Platelets 114 (L) 150 - 450 x10E3/uL   Neutrophils 56 Not Estab. %   Lymphs 31 Not Estab. %    Monocytes 8 Not Estab. %   Eos 3 Not Estab. %   Basos 2 Not Estab. %   Neutrophils Absolute 4.5 1 - 7 x10E3/uL   Lymphocytes Absolute 2.6 0 - 3 x10E3/uL   Monocytes Absolute 0.7 0 - 0 x10E3/uL   EOS (ABSOLUTE) 0.3 0.0 - 0.4 x10E3/uL   Basophils Absolute 0.1 0 - 0 x10E3/uL   Immature Granulocytes 0 Not Estab. %   Immature Grans (Abs) 0.0 0.0 - 0.1 x10E3/uL   Hematology Comments: Note:  Hepatic function panel  Result Value Ref Range   Total Protein 6.7 6.0 - 8.5 g/dL   Albumin 4.4 3.8 - 4.9 g/dL   Bilirubin Total 0.3 0.0 - 1.2 mg/dL   Bilirubin, Direct 0.09 0.00 - 0.40 mg/dL   Alkaline Phosphatase 117 48 - 121 IU/L   AST 20 0 - 40 IU/L   ALT 31 0 - 44 IU/L  Basic metabolic panel  Result Value Ref Range   Glucose 118 (H) 65 - 99 mg/dL   BUN 11 6 - 24 mg/dL   Creatinine, Ser 0.86 0.76 - 1.27 mg/dL   GFR calc non Af Amer 98 >59 mL/min/1.73   GFR calc Af Amer 113 >59 mL/min/1.73   BUN/Creatinine Ratio 13 9 - 20   Sodium 139 134 - 144 mmol/L   Potassium 4.2 3.5 - 5.2 mmol/L   Chloride 103 96 - 106 mmol/L   CO2 26 20 - 29 mmol/L   Calcium 8.9 8.7 - 10.2 mg/dL  Hemoglobin A1c  Result Value Ref Range   Hgb A1c MFr Bld 5.9 (H) 4.8 - 5.6 %   Est. average glucose Bld gHb Est-mCnc 123 mg/dL  PSA  Result Value Ref Range   Prostate Specific Ag, Serum 0.4 0.0 - 4.0 ng/mL    Assessment and Plan:     ICD-10-CM   1. Healthcare maintenance  Z00.00 Td vaccine greater than or equal to 7yo preservative free IM  2. Need for Td vaccine  Z23 Td vaccine greater than or equal to 7yo preservative free IM   He does not want to take chol or BP meds and he smokers He does not want to get a covid vaccine - likely ever  The patient declines routine health maintenance services noted. We reviewed that could lead to missing significant problems that could affect there mortality. The patient indicated that they understood this and was willing to accept those risks.  The 10-year ASCVD risk score Mikey Bussing  DC Brooke Bonito., et al., 2013) is: 22.3%   Values used to calculate the score:     Age: 77 years     Sex: Male     Is Non-Hispanic African American: No     Diabetic: No     Tobacco smoker: Yes     Systolic Blood Pressure: 314 mmHg     Is BP treated: No     HDL Cholesterol: 38 mg/dL     Total Cholesterol: 240 mg/dL    Health Maintenance Exam: The patient's preventative maintenance and recommended screening tests for an annual wellness exam were reviewed in full today. Brought up to date unless services declined.  Counselled on the importance of diet, exercise, and its role in overall health and mortality. The patient's FH and SH was reviewed, including their home life, tobacco status, and drug and alcohol status.  Follow-up in 1 year for physical exam or additional follow-up below.  Follow-up: No follow-ups on file. Or follow-up in 1 year if not noted.  No orders of the defined types were placed in this encounter.  Medications Discontinued During This Encounter  Medication Reason  . albuterol (VENTOLIN HFA) 108 (90 Base) MCG/ACT inhaler Completed Course  . predniSONE (DELTASONE) 10 MG tablet Completed Course  . tamsulosin (FLOMAX) 0.4 MG CAPS capsule Completed Course   Orders Placed This Encounter  Procedures  . Td vaccine greater than or equal to 7yo preservative free IM    Signed,  Tashina Credit T. Malekai Markwood, MD   Allergies as of 04/05/2020  No Known Allergies     Medication List       Accurate as of April 05, 2020 10:44 AM. If you have any questions, ask your nurse or doctor.        STOP taking these medications   albuterol 108 (90 Base) MCG/ACT inhaler Commonly known as: VENTOLIN HFA Stopped by: Owens Loffler, MD   predniSONE 10 MG tablet Commonly known as: DELTASONE Stopped by: Owens Loffler, MD   tamsulosin 0.4 MG Caps capsule Commonly known as: FLOMAX Stopped by: Owens Loffler, MD     TAKE these medications   tadalafil 20 MG tablet Commonly known as:  CIALIS Take 0.5-1 tablets (10-20 mg total) by mouth every other day as needed for erectile dysfunction.

## 2020-04-05 ENCOUNTER — Encounter: Payer: Self-pay | Admitting: Family Medicine

## 2020-04-05 ENCOUNTER — Ambulatory Visit (INDEPENDENT_AMBULATORY_CARE_PROVIDER_SITE_OTHER): Payer: Commercial Managed Care - PPO | Admitting: Family Medicine

## 2020-04-05 ENCOUNTER — Other Ambulatory Visit: Payer: Self-pay

## 2020-04-05 VITALS — BP 158/86 | HR 79 | Temp 97.6°F | Ht 73.0 in | Wt 271.0 lb

## 2020-04-05 DIAGNOSIS — Z0000 Encounter for general adult medical examination without abnormal findings: Secondary | ICD-10-CM | POA: Diagnosis not present

## 2020-04-05 DIAGNOSIS — Z23 Encounter for immunization: Secondary | ICD-10-CM | POA: Diagnosis not present

## 2020-08-21 ENCOUNTER — Encounter: Payer: Self-pay | Admitting: Otolaryngology

## 2020-08-29 ENCOUNTER — Other Ambulatory Visit
Admission: RE | Admit: 2020-08-29 | Discharge: 2020-08-29 | Disposition: A | Discharge status: Home / Self Care | Payer: Commercial Managed Care - PPO | Source: Ambulatory Visit | Attending: Otolaryngology | Admitting: Otolaryngology

## 2020-08-29 ENCOUNTER — Other Ambulatory Visit: Payer: Self-pay

## 2020-08-29 DIAGNOSIS — Z20822 Contact with and (suspected) exposure to covid-19: Secondary | ICD-10-CM | POA: Insufficient documentation

## 2020-08-29 DIAGNOSIS — Z01812 Encounter for preprocedural laboratory examination: Secondary | ICD-10-CM | POA: Diagnosis not present

## 2020-08-30 LAB — SARS CORONAVIRUS 2 (TAT 6-24 HRS): SARS Coronavirus 2: NEGATIVE

## 2020-08-31 ENCOUNTER — Encounter: Payer: Self-pay | Admitting: Otolaryngology

## 2020-08-31 ENCOUNTER — Ambulatory Visit
Admission: RE | Admit: 2020-08-31 | Discharge: 2020-08-31 | Disposition: A | Discharge status: Home / Self Care | Payer: Commercial Managed Care - PPO | Attending: Otolaryngology | Admitting: Otolaryngology

## 2020-08-31 ENCOUNTER — Ambulatory Visit: Payer: Commercial Managed Care - PPO | Admitting: Anesthesiology

## 2020-08-31 ENCOUNTER — Encounter
Admission: RE | Disposition: A | Discharge status: Home / Self Care | Payer: Self-pay | Source: Home / Self Care | Attending: Otolaryngology

## 2020-08-31 ENCOUNTER — Other Ambulatory Visit: Payer: Self-pay

## 2020-08-31 DIAGNOSIS — J343 Hypertrophy of nasal turbinates: Secondary | ICD-10-CM | POA: Insufficient documentation

## 2020-08-31 DIAGNOSIS — Z8249 Family history of ischemic heart disease and other diseases of the circulatory system: Secondary | ICD-10-CM | POA: Diagnosis not present

## 2020-08-31 DIAGNOSIS — Z809 Family history of malignant neoplasm, unspecified: Secondary | ICD-10-CM | POA: Diagnosis not present

## 2020-08-31 DIAGNOSIS — G4730 Sleep apnea, unspecified: Secondary | ICD-10-CM | POA: Diagnosis not present

## 2020-08-31 DIAGNOSIS — F17200 Nicotine dependence, unspecified, uncomplicated: Secondary | ICD-10-CM | POA: Insufficient documentation

## 2020-08-31 DIAGNOSIS — J342 Deviated nasal septum: Secondary | ICD-10-CM | POA: Insufficient documentation

## 2020-08-31 HISTORY — PX: TURBINATE REDUCTION: SHX6157

## 2020-08-31 HISTORY — PX: SEPTOPLASTY: SHX2393

## 2020-08-31 SURGERY — SEPTOPLASTY, NOSE
Anesthesia: General | Site: Nose | Laterality: Right

## 2020-08-31 MED ORDER — ONDANSETRON HCL 4 MG/2ML IJ SOLN
INTRAMUSCULAR | Status: DC | PRN
  Administered 2020-08-31: 4 mg via INTRAVENOUS

## 2020-08-31 MED ORDER — PHENYLEPHRINE HCL (PRESSORS) 10 MG/ML IV SOLN
INTRAVENOUS | Status: DC | PRN
  Administered 2020-08-31: 50 ug via INTRAVENOUS
  Administered 2020-08-31: 100 ug via INTRAVENOUS
  Administered 2020-08-31 (×2): 50 ug via INTRAVENOUS

## 2020-08-31 MED ORDER — FENTANYL CITRATE (PF) 100 MCG/2ML IJ SOLN: 25.0000 ug | INTRAMUSCULAR | Status: DC | PRN

## 2020-08-31 MED ORDER — HYDRALAZINE HCL 20 MG/ML IJ SOLN
10.0000 mg | INTRAMUSCULAR | Status: DC | PRN
Start: 2020-08-31 — End: 2020-08-31
  Administered 2020-08-31: 10 mg via INTRAVENOUS

## 2020-08-31 MED ORDER — PROPOFOL 10 MG/ML IV BOLUS
INTRAVENOUS | Status: DC | PRN
  Administered 2020-08-31: 200 mg via INTRAVENOUS
  Administered 2020-08-31: 50 mg via INTRAVENOUS

## 2020-08-31 MED ORDER — HYDROCODONE-ACETAMINOPHEN 5-325 MG PO TABS: 1.0000 | ORAL_TABLET | Freq: Four times a day (QID) | ORAL | 0 refills | Status: AC | PRN

## 2020-08-31 MED ORDER — SCOPOLAMINE 1 MG/3DAYS TD PT72
1.0000 | MEDICATED_PATCH | TRANSDERMAL | Status: DC
  Administered 2020-08-31: 1.5 mg via TRANSDERMAL

## 2020-08-31 MED ORDER — FENTANYL CITRATE (PF) 100 MCG/2ML IJ SOLN
INTRAMUSCULAR | Status: DC | PRN
  Administered 2020-08-31: 100 ug via INTRAVENOUS

## 2020-08-31 MED ORDER — MIDAZOLAM HCL 5 MG/5ML IJ SOLN
INTRAMUSCULAR | Status: DC | PRN
  Administered 2020-08-31: 2 mg via INTRAVENOUS

## 2020-08-31 MED ORDER — OXYMETAZOLINE HCL 0.05 % NA SOLN
2.0000 | Freq: Once | NASAL | Status: AC
  Administered 2020-08-31: 2 via NASAL

## 2020-08-31 MED ORDER — LIDOCAINE HCL (CARDIAC) PF 100 MG/5ML IV SOSY
PREFILLED_SYRINGE | INTRAVENOUS | Status: DC | PRN
  Administered 2020-08-31: 50 mg via INTRAVENOUS

## 2020-08-31 MED ORDER — ONDANSETRON HCL 4 MG/2ML IJ SOLN: 4.0000 mg | Freq: Once | INTRAMUSCULAR | Status: DC | PRN

## 2020-08-31 MED ORDER — DEXAMETHASONE SODIUM PHOSPHATE 4 MG/ML IJ SOLN
INTRAMUSCULAR | Status: DC | PRN
  Administered 2020-08-31: 10 mg via INTRAVENOUS

## 2020-08-31 MED ORDER — ACETAMINOPHEN 10 MG/ML IV SOLN
1000.0000 mg | Freq: Once | INTRAVENOUS | Status: AC
  Administered 2020-08-31: 1000 mg via INTRAVENOUS

## 2020-08-31 MED ORDER — OXYCODONE HCL 5 MG/5ML PO SOLN: 5.0000 mg | Freq: Once | ORAL | Status: AC | PRN

## 2020-08-31 MED ORDER — GLYCOPYRROLATE 0.2 MG/ML IJ SOLN
INTRAMUSCULAR | Status: DC | PRN
  Administered 2020-08-31: .1 mg via INTRAVENOUS

## 2020-08-31 MED ORDER — OXYCODONE HCL 5 MG PO TABS
5.0000 mg | ORAL_TABLET | Freq: Once | ORAL | Status: AC | PRN
  Administered 2020-08-31: 5 mg via ORAL

## 2020-08-31 MED ORDER — SUCCINYLCHOLINE CHLORIDE 20 MG/ML IJ SOLN
INTRAMUSCULAR | Status: DC | PRN
  Administered 2020-08-31: 100 mg via INTRAVENOUS

## 2020-08-31 MED ORDER — CEPHALEXIN 500 MG PO CAPS: 500.0000 mg | ORAL_CAPSULE | Freq: Two times a day (BID) | ORAL | 0 refills | Status: DC

## 2020-08-31 MED ORDER — LACTATED RINGERS IV SOLN: INTRAVENOUS | Status: DC

## 2020-08-31 MED ORDER — LIDOCAINE-EPINEPHRINE 1 %-1:100000 IJ SOLN
INTRAMUSCULAR | Status: DC | PRN
  Administered 2020-08-31: 6 mL

## 2020-08-31 MED ORDER — DEXTROSE 5 % IV SOLN
2000.0000 mg | Freq: Once | INTRAVENOUS | Status: AC
  Administered 2020-08-31: 2000 mg via INTRAVENOUS

## 2020-08-31 MED ORDER — PREDNISONE 10 MG PO TABS: ORAL_TABLET | ORAL | 0 refills | Status: DC

## 2020-08-31 MED ORDER — PHENYLEPHRINE HCL 0.5 % NA SOLN
NASAL | Status: DC | PRN
  Administered 2020-08-31: 15 mL via TOPICAL

## 2020-08-31 MED ORDER — KETOROLAC TROMETHAMINE 30 MG/ML IJ SOLN: 15.0000 mg | Freq: Once | INTRAMUSCULAR | Status: DC | PRN

## 2020-08-31 MED ORDER — EPHEDRINE SULFATE 50 MG/ML IJ SOLN
INTRAMUSCULAR | Status: DC | PRN
Start: 2020-08-31 — End: 2020-08-31
  Administered 2020-08-31 (×2): 10 mg via INTRAVENOUS
  Administered 2020-08-31: 5 mg via INTRAVENOUS
  Administered 2020-08-31: 10 mg via INTRAVENOUS
  Administered 2020-08-31 (×2): 5 mg via INTRAVENOUS

## 2020-08-31 SURGICAL SUPPLY — 26 items
CANISTER SUCT 1200ML W/VALVE (MISCELLANEOUS) ×3 IMPLANT
COAGULATOR SUCT 8FR VV (MISCELLANEOUS) ×3 IMPLANT
ELECT REM PT RETURN 9FT ADLT (ELECTROSURGICAL) ×3
ELECTRODE REM PT RTRN 9FT ADLT (ELECTROSURGICAL) ×2 IMPLANT
GLOVE PI ULTRA LF STRL 7.5 (GLOVE) ×4 IMPLANT
GLOVE PI ULTRA NON LATEX 7.5 (GLOVE) ×2
GOWN STRL REUS W/ TWL LRG LVL3 (GOWN DISPOSABLE) ×2 IMPLANT
GOWN STRL REUS W/TWL LRG LVL3 (GOWN DISPOSABLE) ×3
KIT TURNOVER KIT A (KITS) ×3 IMPLANT
NDL ANESTHESIA 27G X 3.5 (NEEDLE) ×2 IMPLANT
NDL HYPO 27GX1-1/4 (NEEDLE) ×2 IMPLANT
NEEDLE ANESTHESIA  27G X 3.5 (NEEDLE) ×3
NEEDLE ANESTHESIA 27G X 3.5 (NEEDLE) ×2 IMPLANT
NEEDLE HYPO 27GX1-1/4 (NEEDLE) ×3 IMPLANT
PACK ENT CUSTOM (PACKS) ×3 IMPLANT
PATTIES SURGICAL .5 X3 (DISPOSABLE) ×3 IMPLANT
SPLINT NASAL SEPTAL BLV .50 ST (MISCELLANEOUS) ×3 IMPLANT
STRAP BODY AND KNEE 60X3 (MISCELLANEOUS) ×3 IMPLANT
SUT CHROMIC 3-0 (SUTURE) ×6
SUT CHROMIC 3-0 KS 27XMFL CR (SUTURE) ×4
SUT ETHILON 3-0 KS 30 BLK (SUTURE) ×3 IMPLANT
SUT PLAIN GUT 4-0 (SUTURE) ×3 IMPLANT
SUTURE CHRMC 3-0 KS 27XMFL CR (SUTURE) IMPLANT
SYR 3ML LL SCALE MARK (SYRINGE) ×3 IMPLANT
TOWEL OR 17X26 4PK STRL BLUE (TOWEL DISPOSABLE) ×3 IMPLANT
WATER STERILE IRR 250ML POUR (IV SOLUTION) ×3 IMPLANT

## 2020-08-31 NOTE — Discharge Instructions (Signed)
[facility] °[facility] °ENDOSCOPIC SINUS SURGERY °[facility] ° °What is Functional Endoscopic Sinus Surgery? ° The Surgery involves making the natural openings of the sinuses larger by removing the bony partitions that separate the sinuses from the nasal cavity.  The natural sinus lining is preserved as much as possible to allow the sinuses to resume normal function after the surgery.  In some patients nasal polyps (excessively swollen lining of the sinuses) may be removed to relieve obstruction of the sinus openings.  The surgery is performed through the nose using lighted scopes, which eliminates the need for incisions on the face.  A septoplasty is a different procedure which is sometimes performed with sinus surgery.  It involves straightening the boy partition that separates the two sides of your nose.  A crooked or deviated septum may need repair if is obstructing the sinuses or nasal airflow.  Turbinate reduction is also often performed during sinus surgery.  The turbinates are bony proturberances from the side walls of the nose which swell and can obstruct the nose in patients with sinus and allergy problems.  Their size can be surgically reduced to help relieve nasal obstruction. ° °What Can Sinus Surgery Do For Me? ° Sinus surgery can reduce the frequency of sinus infections requiring antibiotic treatment.  This can provide improvement in nasal congestion, post-nasal drainage, facial pressure and nasal obstruction.  Surgery will NOT prevent you from ever having an infection again, so it usually only for patients who get infections 4 or more times yearly requiring antibiotics, or for infections that do not clear with antibiotics.  It will not cure nasal allergies, so patients with allergies may still require medication to treat their allergies after surgery. Surgery may improve headaches related to sinusitis, however, some people will continue to  require medication to control sinus headaches related to allergies.  Surgery will do nothing for other forms of headache (migraine, tension or cluster). ° °What Are the Risks of Endoscopic Sinus Surgery? ° Current techniques allow surgery to be performed safely with little risk, however, there are rare complications that patients should be aware of.  Because the sinuses are located around the eyes, there is risk of eye injury, including blindness, though again, this would be quite rare. This is usually a result of bleeding behind the eye during surgery, which puts the vision oat risk, though there are treatments to protect the vision and prevent permanent disrupted by surgery causing a leak of the spinal fluid that surrounds the brain.  More serious complications would include bleeding inside the brain cavity or damage to the brain.  Again, all of these complications are uncommon, and spinal fluid leaks can be safely managed surgically if they occur.  The most common complication of sinus surgery is bleeding from the nose, which may require packing or cauterization of the nose.  Continued sinus have polyps may experience recurrence of the polyps requiring revision surgery.  Alterations of sense of smell or injury to the tear ducts are also rare complications.  ° °What is the Surgery Like, and what is the Recovery? ° The Surgery usually takes a couple of hours to perform, and is usually performed under a general anesthetic (completely asleep).  Patients are usually discharged home after a couple of hours.  Sometimes during surgery it is necessary to pack the nose to control bleeding, and the packing is left in place for 24 - 48 hours, and removed by your surgeon.    If a septoplasty was performed during the procedure, there is often a splint placed which must be removed after 5-7 days.   °Discomfort: Pain is usually mild to moderate, and can be controlled by prescription pain medication or acetaminophen (Tylenol).   Aspirin, Ibuprofen (Advil, Motrin), or Naprosyn (Aleve) should be avoided, as they can cause increased bleeding.  Most patients feel sinus pressure like they have a bad head cold for several days.  Sleeping with your head elevated can help reduce swelling and facial pressure, as can ice packs over the face.  A humidifier may be helpful to keep the mucous and blood from drying in the nose.  ° °Diet: There are no specific diet restrictions, however, you should generally start with clear liquids and a light diet of bland foods because the anesthetic can cause some nausea.  Advance your diet depending on how your stomach feels.  Taking your pain medication with food will often help reduce stomach upset which pain medications can cause. ° °Nasal Saline Irrigation: It is important to remove blood clots and dried mucous from the nose as it is healing.  This is done by having you irrigate the nose at least 3 - 4 times daily with a salt water solution.  We recommend using NeilMed Sinus Rinse (available at the drug store).  Fill the squeeze bottle with the solution, bend over a sink, and insert the tip of the squeeze bottle into the nose ½ of an inch.  Point the tip of the squeeze bottle towards the inside corner of the eye on the same side your irrigating.  Squeeze the bottle and gently irrigate the nose.  If you bend forward as you do this, most of the fluid will flow back out of the nose, instead of down your throat.   The solution should be warm, near body temperature, when you irrigate.   Each time you irrigate, you should use a full squeeze bottle.  ° °Note that if you are instructed to use Nasal Steroid Sprays at any time after your surgery, irrigate with saline BEFORE using the steroid spray, so you do not wash it all out of the nose. °Another product, Nasal Saline Gel (such as AYR Nasal Saline Gel) can be applied in each nostril 3 - 4 times daily to moisture the nose and reduce scabbing or crusting. ° °Bleeding:   Bloody drainage from the nose can be expected for several days, and patients are instructed to irrigate their nose frequently with salt water to help remove mucous and blood clots.  The drainage may be dark red or brown, though some fresh blood may be seen intermittently, especially after irrigation.  Do not blow you nose, as bleeding may occur. If you must sneeze, keep your mouth open to allow air to escape through your mouth. ° °If heavy bleeding occurs: Irrigate the nose with saline to rinse out clots, then spray the nose 3 - 4 times with Afrin Nasal Decongestant Spray.  The spray will constrict the blood vessels to slow bleeding.  Pinch the lower half of your nose shut to apply pressure, and lay down with your head elevated.  Ice packs over the nose may help as well. If bleeding persists despite these measures, you should notify your doctor.  Do not use the Afrin routinely to control nasal congestion after surgery, as it can result in worsening congestion and may affect healing.  ° ° ° °Activity: Return to work varies among patients. Most patients will be   out of work at least 5 - 7 days to recover.  Patient may return to work after they are off of narcotic pain medication, and feeling well enough to perform the functions of their job.  Patients must avoid heavy lifting (over 10 pounds) or strenuous physical for 2 weeks after surgery, so your employer may need to assign you to light duty, or keep you out of work longer if light duty is not possible.  NOTE: you should not drive, operate dangerous machinery, do any mentally demanding tasks or make any important legal or financial decisions while on narcotic pain medication and recovering from the general anesthetic.    Call Your Doctor Immediately if You Have Any of the Following: 1. Bleeding that you cannot control with the above measures 2. Loss of vision, double vision, bulging of the eye or black eyes. 3. Fever over 101 degrees 4. Neck stiffness with  severe headache, fever, nausea and change in mental state. You are always encourage to call anytime with concerns, however, please call with requests for pain medication refills during office hours.  Office Endoscopy: During follow-up visits your doctor will remove any packing or splints that may have been placed and evaluate and clean your sinuses endoscopically.  Topical anesthetic will be used to make this as comfortable as possible, though you may want to take your pain medication prior to the visit.  How often this will need to be done varies from patient to patient.  After complete recovery from the surgery, you may need follow-up endoscopy from time to time, particularly if there is concern of recurrent infection or nasal polyps.  General Anesthesia, Adult, Care After This sheet gives you information about how to care for yourself after your procedure. Your health care provider may also give you more specific instructions. If you have problems or questions, contact your health care provider. What can I expect after the procedure? After the procedure, the following side effects are common:  Pain or discomfort at the IV site.  Nausea.  Vomiting.  Sore throat.  Trouble concentrating.  Feeling cold or chills.  Feeling weak or tired.  Sleepiness and fatigue.  Soreness and body aches. These side effects can affect parts of the body that were not involved in surgery. Follow these instructions at home: For the time period you were told by your health care provider:  Rest.  Do not participate in activities where you could fall or become injured.  Do not drive or use machinery.  Do not drink alcohol.  Do not take sleeping pills or medicines that cause drowsiness.  Do not make important decisions or sign legal documents.  Do not take care of children on your own.   Eating and drinking  Follow any instructions from your health care provider about eating or drinking  restrictions.  When you feel hungry, start by eating small amounts of foods that are soft and easy to digest (bland), such as toast. Gradually return to your regular diet.  Drink enough fluid to keep your urine pale yellow.  If you vomit, rehydrate by drinking water, juice, or clear broth. General instructions  If you have sleep apnea, surgery and certain medicines can increase your risk for breathing problems. Follow instructions from your health care provider about wearing your sleep device: ? Anytime you are sleeping, including during daytime naps. ? While taking prescription pain medicines, sleeping medicines, or medicines that make you drowsy.  Have a responsible adult stay with you for the   time you are told. It is important to have someone help care for you until you are awake and alert.  Return to your normal activities as told by your health care provider. Ask your health care provider what activities are safe for you.  Take over-the-counter and prescription medicines only as told by your health care provider.  If you smoke, do not smoke without supervision.  Keep all follow-up visits as told by your health care provider. This is important. Contact a health care provider if:  You have nausea or vomiting that does not get better with medicine.  You cannot eat or drink without vomiting.  You have pain that does not get better with medicine.  You are unable to pass urine.  You develop a skin rash.  You have a fever.  You have redness around your IV site that gets worse. Get help right away if:  You have difficulty breathing.  You have chest pain.  You have blood in your urine or stool, or you vomit blood. Summary  After the procedure, it is common to have a sore throat or nausea. It is also common to feel tired.  Have a responsible adult stay with you for the time you are told. It is important to have someone help care for you until you are awake and  alert.  When you feel hungry, start by eating small amounts of foods that are soft and easy to digest (bland), such as toast. Gradually return to your regular diet.  Drink enough fluid to keep your urine pale yellow.  Return to your normal activities as told by your health care provider. Ask your health care provider what activities are safe for you. This information is not intended to replace advice given to you by your health care provider. Make sure you discuss any questions you have with your health care provider. Document Revised: 04/13/2020 Document Reviewed: 11/11/2019 Elsevier Patient Education  2021 Elsevier Inc.   Scopolamine skin patches Remove in 72 hrs. Wash hands immediately after removal. What is this medicine? SCOPOLAMINE (skoe POL a meen) is used to prevent nausea and vomiting caused by motion sickness, anesthesia and surgery. This medicine may be used for other purposes; ask your health care provider or pharmacist if you have questions. COMMON BRAND NAME(S): Transderm Scop What should I tell my health care provider before I take this medicine? They need to know if you have any of these conditions:  are scheduled to have a gastric secretion test  glaucoma  heart disease  kidney disease  liver disease  lung or breathing disease, like asthma  mental illness  prostate disease  seizures  stomach or intestine problems  trouble passing urine  an unusual or allergic reaction to scopolamine, atropine, other medicines, foods, dyes, or preservatives  pregnant or trying to get pregnant  breast-feeding How should I use this medicine? This medicine is for external use only. Follow the directions on the prescription label. Wear only 1 patch at a time. Choose an area behind the ear, that is clean, dry, hairless and free from any cuts or irritation. Wipe the area with a clean dry tissue. Peel off the plastic backing of the skin patch, trying not to touch the  adhesive side with your hands. Do not cut the patches. Firmly apply to the area you have chosen, with the metallic side of the patch to the skin and the tan-colored side showing. Once firmly in place, wash your hands well with soap and   water. Do not get this medicine into your eyes. After removing the patch, wash your hands and the area behind your ear thoroughly with soap and water. The patch will still contain some medicine after use. To avoid accidental contact or ingestion by children or pets, fold the used patch in half with the sticky side together and throw away in the trash out of the reach of children and pets. If you need to use a second patch after you remove the first, place it behind the other ear. A special MedGuide will be given to you by the pharmacist with each prescription and refill. Be sure to read this information carefully each time. Talk to your pediatrician regarding the use of this medicine in children. Special care may be needed. Overdosage: If you think you have taken too much of this medicine contact a poison control center or emergency room at once. NOTE: This medicine is only for you. Do not share this medicine with others. What if I miss a dose? This does not apply. This medicine is not for regular use. What may interact with this medicine?  alcohol  antihistamines for allergy cough and cold  atropine  certain medicines for anxiety or sleep  certain medicines for bladder problems like oxybutynin, tolterodine  certain medicines for depression like amitriptyline, fluoxetine, sertraline  certain medicines for stomach problems like dicyclomine, hyoscyamine  certain medicines for Parkinson's disease like benztropine, trihexyphenidyl  certain medicines for seizures like phenobarbital, primidone  general anesthetics like halothane, isoflurane, methoxyflurane, propofol  ipratropium  local anesthetics like lidocaine, pramoxine, tetracaine  medicines that relax  muscles for surgery  phenothiazines like chlorpromazine, mesoridazine, prochlorperazine, thioridazine  narcotic medicines for pain  other belladonna alkaloids This list may not describe all possible interactions. Give your health care provider a list of all the medicines, herbs, non-prescription drugs, or dietary supplements you use. Also tell them if you smoke, drink alcohol, or use illegal drugs. Some items may interact with your medicine. What should I watch for while using this medicine? Limit contact with water while swimming and bathing because the patch may fall off. If the patch falls off, throw it away and put a new one behind the other ear. You may get drowsy or dizzy. Do not drive, use machinery, or do anything that needs mental alertness until you know how this medicine affects you. Do not stand or sit up quickly, especially if you are an older patient. This reduces the risk of dizzy or fainting spells. Alcohol may interfere with the effect of this medicine. Avoid alcoholic drinks. Your mouth may get dry. Chewing sugarless gum or sucking hard candy, and drinking plenty of water may help. Contact your healthcare professional if the problem does not go away or is severe. This medicine may cause dry eyes and blurred vision. If you wear contact lenses, you may feel some discomfort. Lubricating drops may help. See your healthcare professional if the problem does not go away or is severe. If you are going to need surgery, an MRI, CT scan, or other procedure, tell your healthcare professional that you are using this medicine. You may need to remove the patch before the procedure. What side effects may I notice from receiving this medicine? Side effects that you should report to your doctor or health care professional as soon as possible:  allergic reactions like skin rash, itching or hives; swelling of the face, lips, or tongue  blurred vision  changes in  vision  confusion  dizziness    eye pain  fast, irregular heartbeat  hallucinations, loss of contact with reality  nausea, vomiting  pain or trouble passing urine  restlessness  seizures  skin irritation  stomach pain Side effects that usually do not require medical attention (report to your doctor or health care professional if they continue or are bothersome):  drowsiness  dry mouth  headache  sore throat This list may not describe all possible side effects. Call your doctor for medical advice about side effects. You may report side effects to FDA at 1-800-FDA-1088. Where should I keep my medicine? Keep out of the reach of children. Store at room temperature between 20 and 25 degrees C (68 and 77 degrees F). Keep this medicine in the foil package until ready to use. Throw away any unused medicine after the expiration date. NOTE: This sheet is a summary. It may not cover all possible information. If you have questions about this medicine, talk to your doctor, pharmacist, or health care provider.  2021 Elsevier/Gold Standard (2017-10-17 16:14:46)  

## 2020-08-31 NOTE — Anesthesia Postprocedure Evaluation (Signed)
Anesthesia Post Note  Patient: [facility]  Procedure(s) Performed: SEPTOPLASTY (Bilateral Nose) TURBINATE REDUCTION (Right Nose)     Patient location during evaluation: PACU Anesthesia Type: General Level of consciousness: awake and alert Pain management: pain level controlled Vital Signs Assessment: post-procedure vital signs reviewed and stable Respiratory status: spontaneous breathing, nonlabored ventilation, respiratory function stable and patient connected to nasal cannula oxygen Cardiovascular status: blood pressure returned to baseline and stable (Advised patient to see PCP regarding HTN) Postop Assessment: no apparent nausea or vomiting Anesthetic complications: no   No complications documented.  Sinda Du

## 2020-08-31 NOTE — Anesthesia Procedure Notes (Addendum)
Procedure Name: Intubation Date/Time: 08/31/2020 9:58 AM Performed by: Mayme Genta, CRNA Pre-anesthesia Checklist: Patient identified, Emergency Drugs available, Suction available, Patient being monitored and Timeout performed Patient Re-evaluated:Patient Re-evaluated prior to induction Oxygen Delivery Method: Circle system utilized Preoxygenation: Pre-oxygenation with 100% oxygen Induction Type: IV induction Ventilation: Mask ventilation without difficulty Grade View: Grade II Tube type: Oral Rae Tube size: 8.0 mm Number of attempts: 2 Airway Equipment and Method: Stylet and Video-laryngoscopy Placement Confirmation: ETT inserted through vocal cords under direct vision,  positive ETCO2 and breath sounds checked- equal and bilateral Tube secured with: Tape Dental Injury: Teeth and Oropharynx as per pre-operative assessment  Difficulty Due To: Difficult Airway- due to limited oral opening, Difficult Airway- due to reduced neck mobility, Difficult Airway- due to anterior larynx and Difficult Airway- due to large tongue Comments: Unable to view cords with Miller 3. Able to mask with 2 hands. Glidescope with #4 blade, obtained grade I view. ETT inserted with some difficulty with stylet. +/= BBS. + EtCO2.

## 2020-08-31 NOTE — Transfer of Care (Signed)
Immediate Anesthesia Transfer of Care Note  Patient: [facility]  Procedure(s) Performed: SEPTOPLASTY (Bilateral Nose) TURBINATE REDUCTION (Right Nose)  Patient Location: PACU  Anesthesia Type: General  Level of Consciousness: awake, alert  and patient cooperative  Airway and Oxygen Therapy: Patient Spontanous Breathing and Patient connected to supplemental oxygen  Post-op Assessment: Post-op Vital signs reviewed, Patient's Cardiovascular Status Stable, Respiratory Function Stable, Patent Airway and No signs of Nausea or vomiting  Post-op Vital Signs: Reviewed and stable  Complications: No complications documented.

## 2020-08-31 NOTE — Anesthesia Preprocedure Evaluation (Signed)
Anesthesia Evaluation  Patient identified by MRN, date of birth, ID band Patient awake    Reviewed: Allergy & Precautions, H&P , NPO status , Patient's Chart, lab work & pertinent test results  Airway Mallampati: II  TM Distance: >3 FB Neck ROM: Full    Dental no notable dental hx.    Pulmonary sleep apnea , Current Smoker and Patient abstained from smoking.,    Pulmonary exam normal breath sounds clear to auscultation       Cardiovascular Exercise Tolerance: Good Normal cardiovascular exam Rhythm:Regular Rate:Normal  Normal BP "145/90", in preop today 170/95. Denies any CP, SOB, headache, or visual changes. HLD   Neuro/Psych negative neurological ROS  negative psych ROS   GI/Hepatic negative GI ROS, Neg liver ROS,   Endo/Other  negative endocrine ROS  Renal/GU negative Renal ROS  negative genitourinary   Musculoskeletal negative musculoskeletal ROS (+)   Abdominal Normal abdominal exam  (+) - obese,  Abdomen: soft.    Peds negative pediatric ROS (+)  Hematology negative hematology ROS (+)   Anesthesia Other Findings   Reproductive/Obstetrics negative OB ROS                             Anesthesia Physical Anesthesia Plan  ASA: II  Anesthesia Plan: General   Post-op Pain Management:    Induction: Intravenous  PONV Risk Score and Plan: Treatment may vary due to age or medical condition  Airway Management Planned: Oral ETT  Additional Equipment:   Intra-op Plan:   Post-operative Plan: Extubation in OR  Informed Consent: I have reviewed the patients History and Physical, chart, labs and discussed the procedure including the risks, benefits and alternatives for the proposed anesthesia with the patient or authorized representative who has indicated his/her understanding and acceptance.     Dental advisory given  Plan Discussed with: CRNA, Anesthesiologist and  Surgeon  Anesthesia Plan Comments:         Anesthesia Quick Evaluation  Patient Active Problem List   Diagnosis Date Noted  . Varicose veins of both lower extremities with inflammation 03/11/2019  . Smoker 10/30/2011  . OSA (obstructive sleep apnea) 12/06/2010  . Personal history of malignant neoplasm of skin 11/12/2010  . Hyperlipidemia 11/09/2010    CBC Latest Ref Rng & Units 03/30/2020 04/19/2019 10/19/2018  WBC 3.4 - 10.8 x10E3/uL 8.1 9.5 7.7  Hemoglobin 13.0 - 17.7 g/dL 15.7 15.8 16.0  Hematocrit 37.5 - 51.0 % 46.2 47.5 47.1  Platelets 150 - 450 x10E3/uL 114(L) 134(L) 103.0(L)   BMP Latest Ref Rng & Units 03/30/2020 04/19/2019 10/19/2018  Glucose 65 - 99 mg/dL 118(H) 113(H) 92  BUN 6 - 24 mg/dL 11 9 12   Creatinine 0.76 - 1.27 mg/dL 0.86 0.99 0.91  BUN/Creat Ratio 9 - 20 13 - -  Sodium 134 - 144 mmol/L 139 141 139  Potassium 3.5 - 5.2 mmol/L 4.2 3.5 3.9  Chloride 96 - 106 mmol/L 103 107 103  CO2 20 - 29 mmol/L 26 24 29   Calcium 8.7 - 10.2 mg/dL 8.9 9.2 9.1    Risks and benefits of anesthesia discussed at length, patient or surrogate demonstrates understanding. Appropriately NPO. Plan to proceed with anesthesia.  Champ Mungo, MD 08/31/20

## 2020-08-31 NOTE — H&P (Signed)
H&P has been reviewed and patient reevaluated, no changes necessary. To be downloaded later.  

## 2020-08-31 NOTE — Op Note (Signed)
08/31/2020  11:20 AM [id number]   Pre-Op Dx:  Deviated Nasal Septum, Hypertrophic right inferior Turbinate  Post-op Dx: Same  Proc: Nasal Septoplasty, Partial Reduction right inferior Turbinate   Surg:  Brandon Edwards  Anes:  GOT  EBL: 30 mL  Comp: None  Findings: Septum extremely deviated to the left side with the cartilage buckled.  The ethmoid plate was to the left and the maxillary crest was the left as well.  The right inferior turbinate was overgrown.  Procedure: With the patient in a comfortable supine position,  general orotracheal anesthesia was induced without difficulty.     The patient received preoperative Afrin spray for topical decongestion and vasoconstriction.  Intravenous prophylactic antibiotics were administered.  At an appropriate level, the patient was placed in a semi-sitting position.  Nasal vibrissae were trimmed.   1% Xylocaine with 1:100,000 epinephrine, 6 cc's, was infiltrated into the anterior floor of the nose, into the nasal spine region, into the membranous columella, and finally into the submucoperichondrial plane of the septum on both sides.  Several minutes were allowed for this to take effect.  Cottoniod pledgetts soaked in Afrin and 4% Xylocaine were placed into both nasal cavities and left while the patient was prepped and draped in the standard fashion.  The materials were removed from the nose and observed to be intact and correct in number.  The nose was inspected with a headlight and zero degree scope with the findings as described above.  A left hemitransfixion incision was sharply executed and carried down to the quadrangular cartilage. The mucoperichondrium was elelvated along both sides of the quadrangular plate back to the bony-cartilaginous junction. The mucoperiostium was then elevated along the ethmoid plate and the vomer. The boney-catilaginous junction was then split with a freer elevator and the mucoperiosteum was elevated on the  opposite side. The mucoperiosteum was then elevated along the maxillary crest as needed to expose the crooked bone of the crest.  Maxillary crest was very thick and had to be chiseled to free up the crooked bone.  Boney spurs of the vomer and maxillary crest were removed with Takahashi forceps.  The cartilaginous plate was trimmed along its posterior and inferior borders of about 2 mm of cartilage to free it up inferiorly. Some of the deviated ethmoid plate was then fractured and removed with Takahashi forceps to free up the posterior border of the quadrangular plate and allow it to swing back to the midline.  Morselized was used to break the cartilage spring so it could sit in a straighter position rather than being bowed to the left.  The mucosal flaps were placed back into their anatomic position to allow visualization of the airways. The septum now sat in the midline with an improved airway.  A 3-0 Chromic suture on a Keith needle in used to anchor the inferior septum at the nasal spine with a through and through suture. The mucosal flaps are then sutured together using a through and through whip stitch of 4-0 Plain Gut with a mini-Keith needle. This was used to close the Hardwick incision as well.   The inferior turbinates were then inspected. An incision was created along the inferior aspect of the right inferior turbinate with removal of some of the inferior soft tissue and bone. Electrocautery was used to control bleeding in the area. The remaining turbinate was then outfractured to open up the airway further. There was no significant bleeding noted. The left inferior turbinate had been pinched and  was underground and did not need any trimming.  The airways were then visualized and showed open passageways on both sides that were significantly improved compared to before surgery. There was no signifcant bleeding. Nasal splints were applied to both sides of the septum using Xomed 0.62mm regular sized  splints that were trimmed, and then held in position with a 3-0 Nylon through and through suture.  The patient was turned back over to anesthesia, and awakened, extubated, and taken to the PACU in satisfactory condition.  Dispo:   PACU to home  Plan: Ice, elevation, narcotic analgesia, steroid taper, and prophylactic antibiotics for the duration of indwelling nasal foreign bodies.  We will reevaluate the patient in the office in 6 days and remove the septal splints.  Return to work in 10 days, strenuous activities in two weeks.   Brandon Edwards 08/31/2020 11:20 AM

## 2020-09-01 ENCOUNTER — Encounter: Payer: Self-pay | Admitting: Otolaryngology

## 2020-10-08 ENCOUNTER — Emergency Department
Admission: EM | Admit: 2020-10-08 | Discharge: 2020-10-08 | Disposition: A | Discharge status: Home / Self Care | Payer: Commercial Managed Care - PPO | Attending: Emergency Medicine | Admitting: Emergency Medicine

## 2020-10-08 ENCOUNTER — Encounter: Payer: Self-pay | Admitting: Emergency Medicine

## 2020-10-08 ENCOUNTER — Other Ambulatory Visit: Payer: Self-pay

## 2020-10-08 DIAGNOSIS — R040 Epistaxis: Secondary | ICD-10-CM | POA: Diagnosis not present

## 2020-10-08 DIAGNOSIS — F17210 Nicotine dependence, cigarettes, uncomplicated: Secondary | ICD-10-CM | POA: Diagnosis not present

## 2020-10-08 LAB — BASIC METABOLIC PANEL
Anion gap: 7 (ref 5–15)
BUN: 13 mg/dL (ref 6–20)
CO2: 26 mmol/L (ref 22–32)
Calcium: 8.9 mg/dL (ref 8.9–10.3)
Chloride: 105 mmol/L (ref 98–111)
Creatinine, Ser: 0.7 mg/dL (ref 0.61–1.24)
GFR, Estimated: >60 mL/min (ref >60)
Glucose, Bld: 98 mg/dL (ref 70–99)
Potassium: 3.7 mmol/L (ref 3.5–5.1)
Sodium: 138 mmol/L (ref 135–145)

## 2020-10-08 LAB — CBC WITH DIFFERENTIAL/PLATELET
Abs Immature Granulocytes: 0.04 10*3/uL (ref 0.00–0.07)
Basophils Absolute: 0.1 10*3/uL (ref 0.0–0.1)
Basophils Relative: 1 %
Eosinophils Absolute: 0.3 10*3/uL (ref 0.0–0.5)
Eosinophils Relative: 3 %
HCT: 45.2 % (ref 39.0–52.0)
Hemoglobin: 15.1 g/dL (ref 13.0–17.0)
Immature Granulocytes: 0 %
Lymphocytes Relative: 34 %
Lymphs Abs: 3.3 10*3/uL (ref 0.7–4.0)
MCH: 29.2 pg (ref 26.0–34.0)
MCHC: 33.4 g/dL (ref 30.0–36.0)
MCV: 87.3 fL (ref 80.0–100.0)
Monocytes Absolute: 0.7 10*3/uL (ref 0.1–1.0)
Monocytes Relative: 7 %
Neutro Abs: 5.3 10*3/uL (ref 1.7–7.7)
Neutrophils Relative %: 55 %
Platelets: 138 10*3/uL — ABNORMAL LOW (ref 150–400)
RBC: 5.18 MIL/uL (ref 4.22–5.81)
RDW: 13.2 % (ref 11.5–15.5)
Smear Review: NORMAL
WBC: 9.6 10*3/uL (ref 4.0–10.5)
nRBC: 0 % (ref 0.0–0.2)

## 2020-10-08 LAB — APTT: aPTT: 31 seconds (ref 24–36)

## 2020-10-08 LAB — PROTIME-INR
INR: 1 (ref 0.8–1.2)
Prothrombin Time: 12.6 seconds (ref 11.4–15.2)

## 2020-10-08 LAB — TYPE AND SCREEN
ABO/RH(D): O POS
Antibody Screen: NEGATIVE

## 2020-10-08 MED ORDER — TRANEXAMIC ACID FOR EPISTAXIS
500.0000 mg | Freq: Once | TOPICAL | Status: AC
  Administered 2020-10-08: 500 mg via TOPICAL
  Filled 2020-10-08: qty 10

## 2020-10-08 MED ORDER — OXYMETAZOLINE HCL 0.05 % NA SOLN
1.0000 | Freq: Once | NASAL | Status: AC
  Administered 2020-10-08: 1 via NASAL
  Filled 2020-10-08: qty 30

## 2020-10-08 NOTE — Discharge Instructions (Addendum)
We are able to get the bleeding to stop.  You should call your ENT doctor tomorrow with them know that this happen.  If the bleeding starts again please return to the ER.  Otherwise he can follow-up with your ENT  Your blood pressure was elevated and should be rechecked in 1 week when this is not happening

## 2020-10-08 NOTE — ED Triage Notes (Signed)
Pt to ED via POV, states nose bleed since 1130 today, pt states approx 7 weeks ago had devaited septum surgery, last suture came out today, pt states after that bleeding started and he has been unable to get it to stop.   Nasal clamp applied in triage. Pt also noted to be hypertensive in triage with no hx of hypertension.

## 2020-10-08 NOTE — ED Provider Notes (Signed)
[facility] Emergency Department Provider Note  ____________________________________________   Event Date/Time   First MD Initiated Contact with Patient 10/08/20 1653     (approximate)  I have reviewed the triage vital signs and the nursing notes.   HISTORY  Chief Complaint Epistaxis    HPI Brandon Edwards is a 55 y.o. male who recently had deviated septum surgery 20 who comes in for epistaxis.  Patient's last stitch came out today.  He states that since this morning he has had bleeding mostly in the right nostril that is been constant, not better with Afrin, nothing makes it worse.  his blood pressure was elevated but states he just feels very uncomfortable secondary to the bleeding.  Denies any nausea or vomiting.  Not on blood thinners.  Review of records patient had septoplasty done by Dr. Kathyrn Sheriff          Past Medical History:  Diagnosis Date  . BCC (basal cell carcinoma), face   . Deviated septum   . Hyperlipidemia   . Sleep apnea   . Smoker 10/30/2011    Patient Active Problem List   Diagnosis Date Noted  . Varicose veins of both lower extremities with inflammation 03/11/2019  . Smoker 10/30/2011  . OSA (obstructive sleep apnea) 12/06/2010  . Personal history of malignant neoplasm of skin 11/12/2010  . Hyperlipidemia 11/09/2010    Past Surgical History:  Procedure Laterality Date  . SEPTOPLASTY Bilateral 08/31/2020   Procedure: SEPTOPLASTY;  Surgeon: Margaretha Murrell, MD;  Location: [place];  Service: ENT;  Laterality: Bilateral;  . TURBINATE REDUCTION Right 08/31/2020   Procedure: TURBINATE REDUCTION;  Surgeon: Margaretha Reynolds, MD;  Location: [place];  Service: ENT;  Laterality: Right;  Marland Kitchen VASECTOMY     cope  . WISDOM TOOTH EXTRACTION     age 5    Prior to Admission medications   Medication Sig Start Date End Date Taking? Authorizing Provider  cephALEXin (KEFLEX) 500 MG capsule Take 1 capsule (500 mg  total) by mouth 2 (two) times daily. 08/31/20   Margaretha Penn, MD  Multiple Vitamin (MULTIVITAMIN) capsule Take 1 capsule by mouth daily.    [provider]  predniSONE (DELTASONE) 10 MG tablet Start with 3 pills tomorrow. Taper over the next 6 days.  3,3,2,2,1,1. 08/31/20   Margaretha Ante, MD  tadalafil (CIALIS) 20 MG tablet Take 0.5-1 tablets (10-20 mg total) by mouth every other day as needed for erectile dysfunction. 03/20/20   Owens Loffler, MD    Allergies Patient has no known allergies.  Family History  Problem Relation Age of Onset  . Allergies Mother   . Allergies Father   . Allergies Brother   . Allergies Brother   . Skin cancer Paternal Grandfather   . Colon cancer Neg Hx   . Esophageal cancer Neg Hx   . Pancreatic cancer Neg Hx   . Stomach cancer Neg Hx     Social History Social History   Tobacco Use  . Smoking status: Current Every Day Smoker    Packs/day: 1.00    Years: 30.00    Pack years: 30.00    Types: Cigarettes  . Smokeless tobacco: Never Used  Vaping Use  . Vaping Use: Never used  Substance Use Topics  . Alcohol use: No    Alcohol/week: 0.0 standard drinks  . Drug use: No      Review of Systems Constitutional: No fever/chills Eyes: No visual changes. ENT: Epistaxis Cardiovascular: Denies chest pain.  Respiratory: Denies shortness of breath. Gastrointestinal: No abdominal pain.  No nausea, no vomiting.  No diarrhea.  No constipation. Genitourinary: Negative for dysuria. Musculoskeletal: Negative for back pain. Skin: Negative for rash. Neurological: Negative for headaches, focal weakness or numbness. All other ROS negative ____________________________________________   PHYSICAL EXAM:  VITAL SIGNS: ED Triage Vitals [10/08/20 1646]  Enc Vitals Group     BP (!) 189/117     Pulse Rate 85     Resp 20     Temp 97.8 F (36.6 C)     Temp Source Oral     SpO2 95 %     Weight 270 lb (122.5 kg)     Height 6\' 1"  (1.854 m)     Head  Circumference      Peak Flow      Pain Score 0     Pain Loc      Pain Edu?      Excl. in [place]?     Constitutional: Alert and oriented. Well appearing and in no acute distress. Eyes: Conjunctivae are normal. EOMI. Head: Atraumatic. Nose: Clots out of both nostrils.  No active bleeding OP with some residual blood but no constant bleeding Mouth/Throat: Mucous membranes are moist.   Neck: No stridor. Trachea Midline. FROM Cardiovascular: Normal rate, regular rhythm. Grossly normal heart sounds.  Good peripheral circulation. Respiratory: Normal respiratory effort.  No retractions. Lungs CTAB. Gastrointestinal: Soft and nontender. No distention. No abdominal bruits.  Musculoskeletal: No lower extremity tenderness nor edema.  No joint effusions. Neurologic:  Normal speech and language. No gross focal neurologic deficits are appreciated.  Skin:  Skin is warm, dry and intact. No rash noted. Psychiatric: Mood and affect are normal. Speech and behavior are normal. GU: Deferred   ____________________________________________   LABS (all labs ordered are listed, but only abnormal results are displayed)  Labs Reviewed  CBC WITH DIFFERENTIAL/PLATELET - Abnormal; Notable for the following components:      Result Value   Platelets 138 (*)    All other components within normal limits  BASIC METABOLIC PANEL  PROTIME-INR  APTT  TYPE AND SCREEN   ____________________________________________   ED ECG REPORT I, Vanessa Glasco, the attending physician, personally viewed and interpreted this ECG.  Normal sinus rate of 78, no ST elevation, no T wave inversions, normal intervals ____________________________________________    PROCEDURES  Procedure(s) performed (including Critical Care):  .Epistaxis Management  Date/Time: 10/08/2020 6:41 PM Performed by: Vanessa DuBois, MD Authorized by: Vanessa New Alluwe, MD   Consent:    Consent obtained:  Verbal   Consent given by:  Patient   Risks,  benefits, and alternatives were discussed: yes     Risks discussed:  Bleeding, nasal injury, infection and pain   Alternatives discussed:  No treatment Universal protocol:    Patient identity confirmed:  Verbally with patient Procedure details:    Treatment site:  L anterior and R anterior   Repair method: Gauze soaked with TXA and Afrin were placed into the nose and clamp was put on the nose.   Treatment complexity:  Limited Post-procedure details:    Assessment:  Bleeding stopped   Procedure completion:  Tolerated     ____________________________________________   INITIAL IMPRESSION / ASSESSMENT AND PLAN / ED COURSE  Elwyn Klosinski Sween was evaluated in Emergency Department on 10/08/2020 for the symptoms described in the history of present illness. He was evaluated in the context of the global COVID-19 pandemic, which necessitated consideration that the  patient might be at risk for infection with the SARS-CoV-2 virus that causes COVID-19. Institutional protocols and algorithms that pertain to the evaluation of patients at risk for COVID-19 are in a state of rapid change based on information released by regulatory bodies including the CDC and federal and state organizations. These policies and algorithms were followed during the patient's care in the ED.    Patient is post septoplasty comes in for epistaxis.  At time of evaluation there is very minimal bleeding.  I did some TXA's and Afrin soaked gauze and placed them.  On repeat evaluation the bleeding seems to have stopped.  Labs were ordered and there is no signs of anemia, coagulopathy.  Blood pressure is elevated but I suspect that this is secondary to the discomfort.  Patient will follow-up for recheck when he is not having epistaxis.   5:50 PM reevaluated patient and taken the TXA and Afrin soaked swabs out.  At this time bleeding has seemed to stop but will continue to closely monitor  Patient is been in the emergency room for over  2 hours without recurrent significant bleeding.  At this time the bleeding is stopped and patient would like to be discharged home.  She will call his ENT doctor tomorrow      ____________________________________________   FINAL CLINICAL IMPRESSION(S) / ED DIAGNOSES   Final diagnoses:  Epistaxis      MEDICATIONS GIVEN DURING THIS VISIT:  Medications  tranexamic acid (CYKLOKAPRON) 1000 MG/10ML topical solution 500 mg (500 mg Topical Given 10/08/20 1714)  oxymetazoline (AFRIN) 0.05 % nasal spray 1 spray (1 spray Each Nare Given 10/08/20 1714)     ED Discharge Orders    None       Note:  This document was prepared using Dragon voice recognition software and may include unintentional dictation errors.   Vanessa [place], MD 10/08/20 [phone]

## 2021-05-11 IMAGING — CR DG CHEST 2V
2 series · 2 of 2 positions shown · non-contrast
Comparison: None available.

CLINICAL DATA: Initial evaluation for acute productive cough.

EXAM:
CHEST - 2 VIEW

[chest pa]
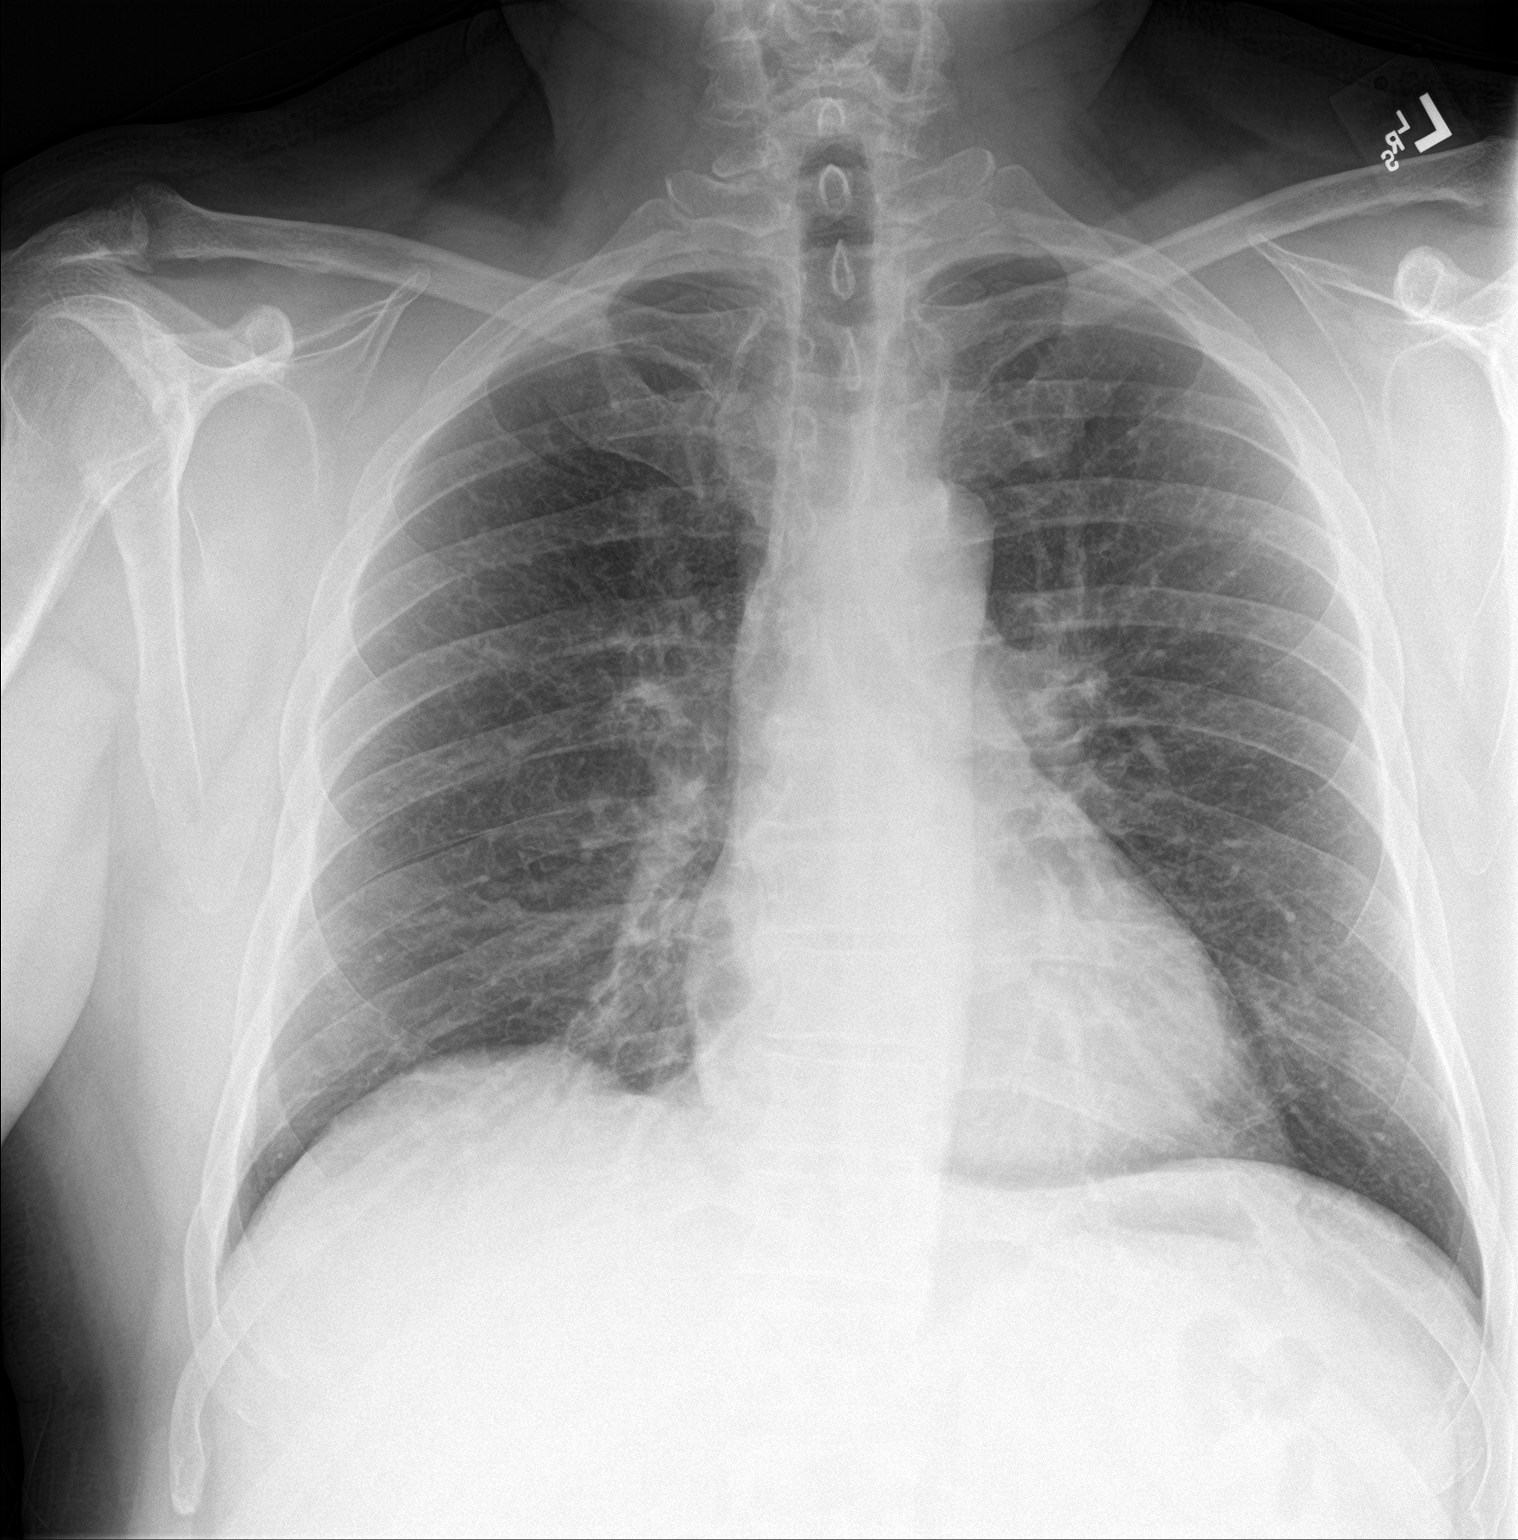

[chest lat]
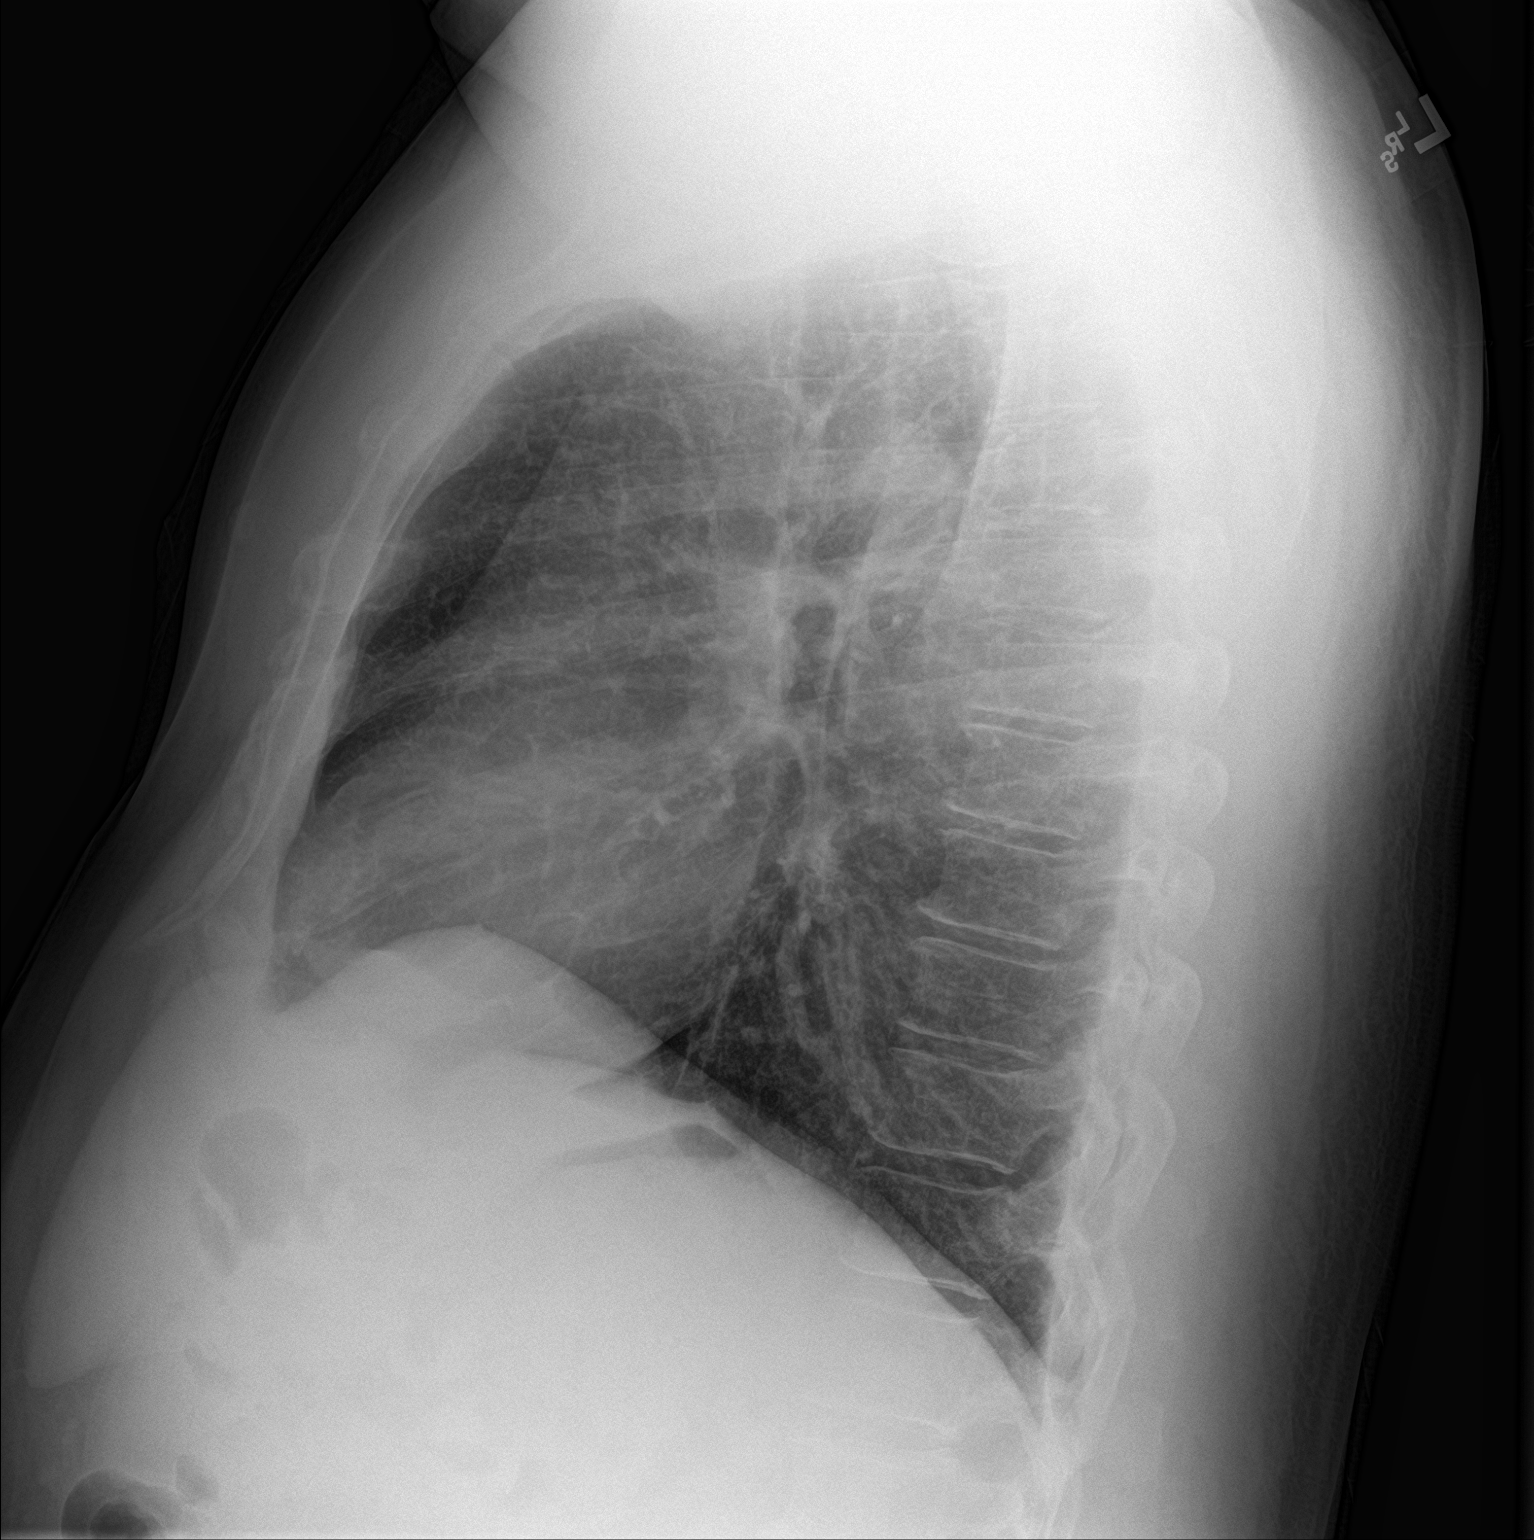

[2 of 2 positions shown; findings below may reference images not displayed]

FINDINGS: Cardiac and mediastinal silhouettes are within normal limits.

Lungs normally inflated. Mild scattered peribronchial thickening,
which could reflect sequelae of acute bronchiolitis given history of
cough. No consolidative opacity to suggest bronchopneumonia. No
pulmonary edema or pleural effusion. No pneumothorax.

No acute osseous finding.
IMPRESSION: Mild scattered peribronchial thickening, which could reflect
sequelae of acute bronchiolitis given history of cough. No
consolidative opacity to suggest bronchopneumonia.

## 2021-05-12 IMAGING — CT CT ANGIO CHEST
3 of 7 series · 18 of 36 positions shown · IV contrast (omnipaque)
Comparison: Radiograph same day

CLINICAL DATA: Shortness of breath, productive cough

EXAM:
CT ANGIOGRAPHY CHEST WITH CONTRAST
TECHNIQUE: Multidetector CT imaging of the chest was performed using the
standard protocol during bolus administration of intravenous
contrast. Multiplanar CT image reconstructions and MIPs were
obtained to evaluate the vascular anatomy.
CONTRAST:  75mL OMNIPAQUE IOHEXOL 350 MG/ML SOLN

[Series 7: pe lung · axial · 0.84mm/px · z∈[+1213,+1269]mm · 2 of 140 slices shown]
[im 28/140  mediastinal]
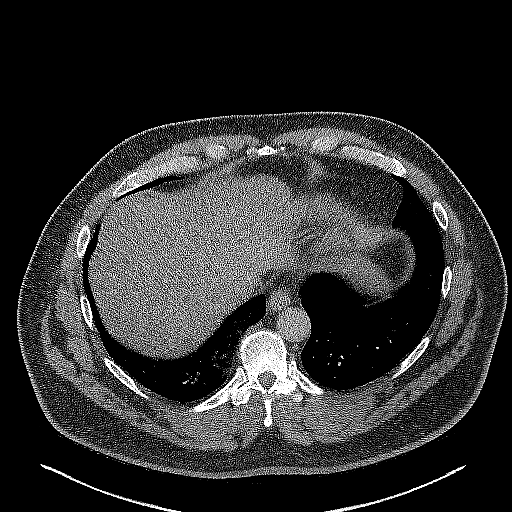
[im 56/140  mediastinal]
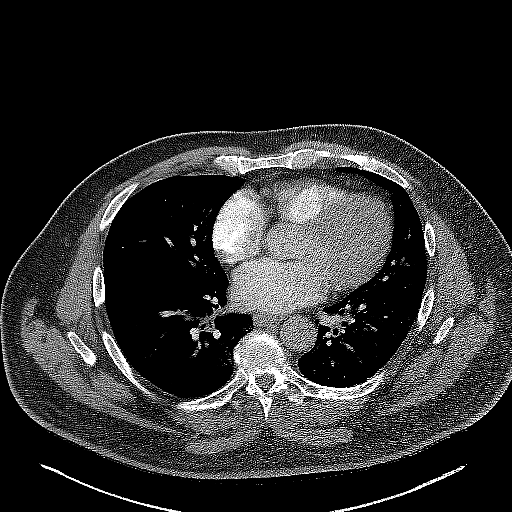

[Series 8: pe thins · axial · 0.84mm/px · z∈[+1162,+1418]mm · 15 of 419 slices shown]
[im 27/419  lung]
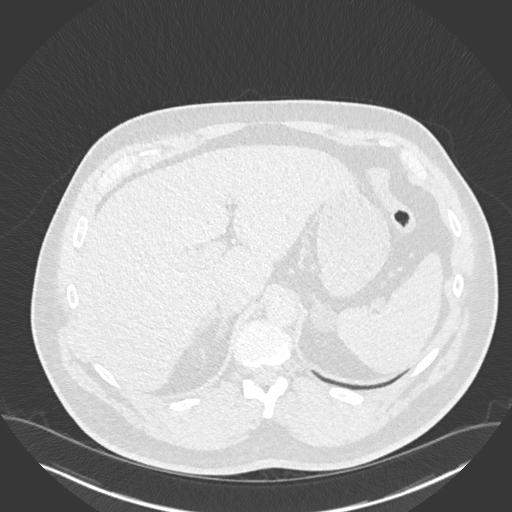
[im 53/419  mediastinal]
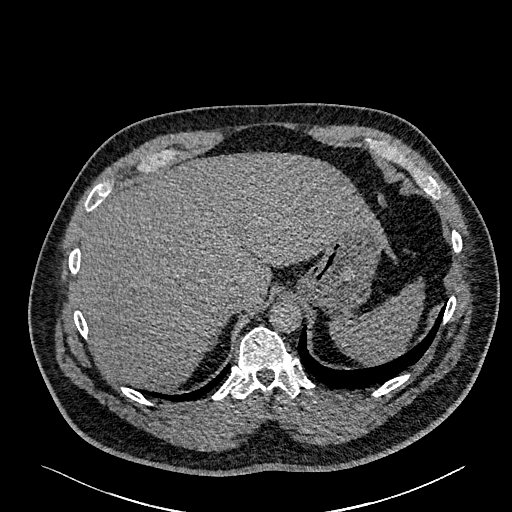
[im 79/419  lung]
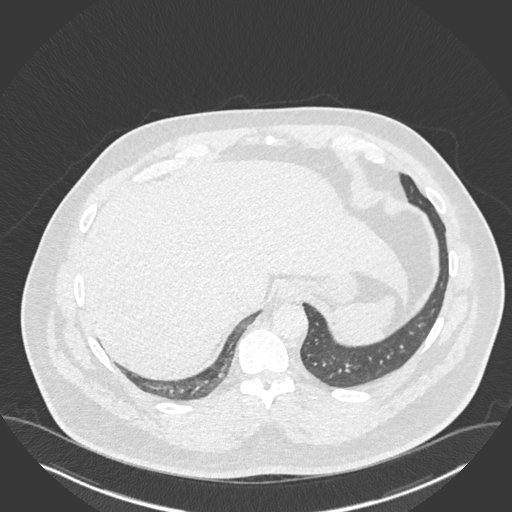
[im 105/419  mediastinal]
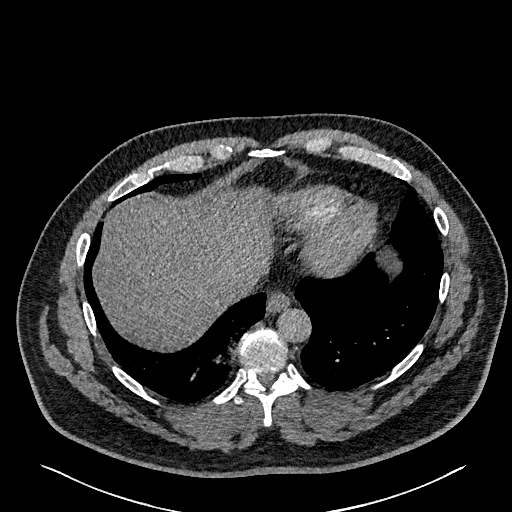
[im 131/419  lung]
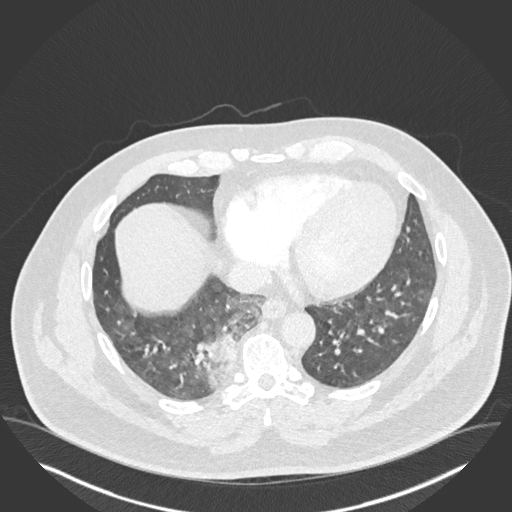
[im 157/419  mediastinal]
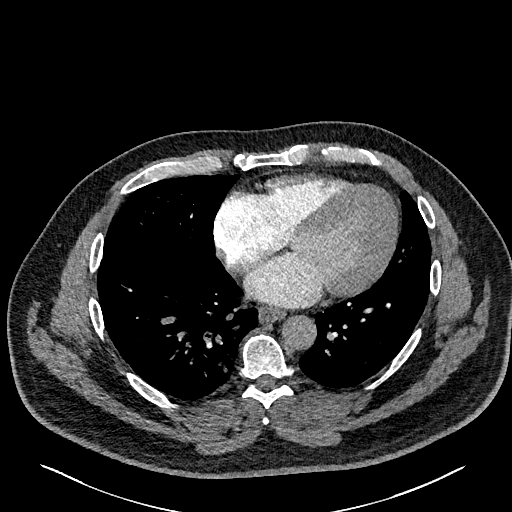
[im 183/419  lung]
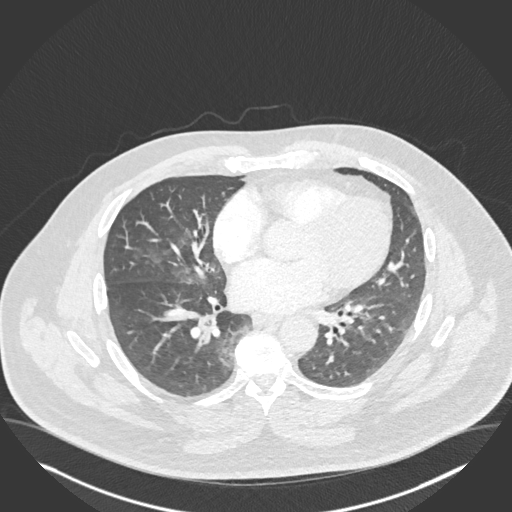
[im 210/419  mediastinal]
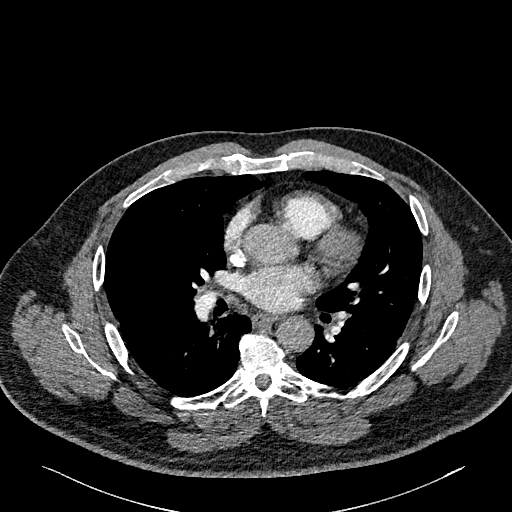
[im 236/419  lung]
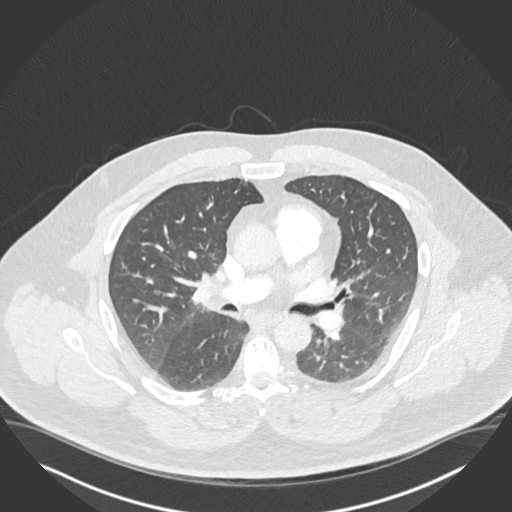
[im 262/419  mediastinal]
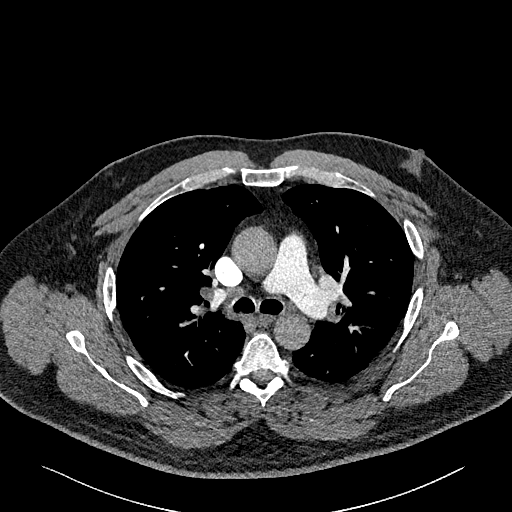
[im 288/419  lung]
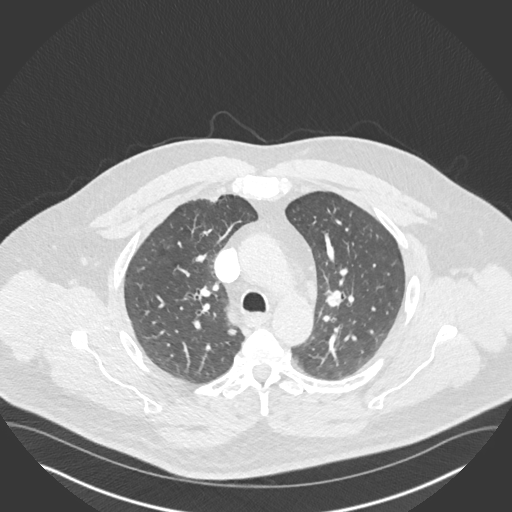
[im 314/419  mediastinal]
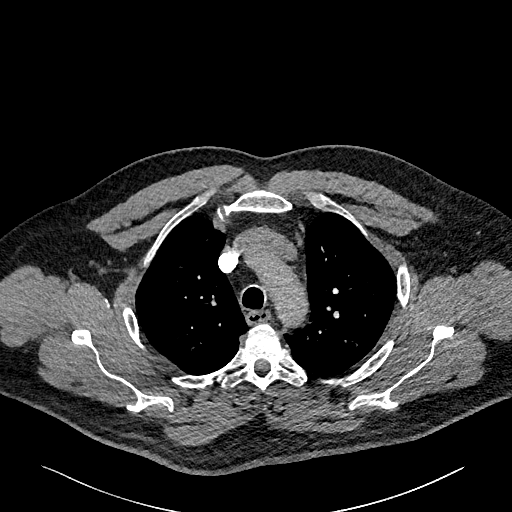
[im 340/419  lung]
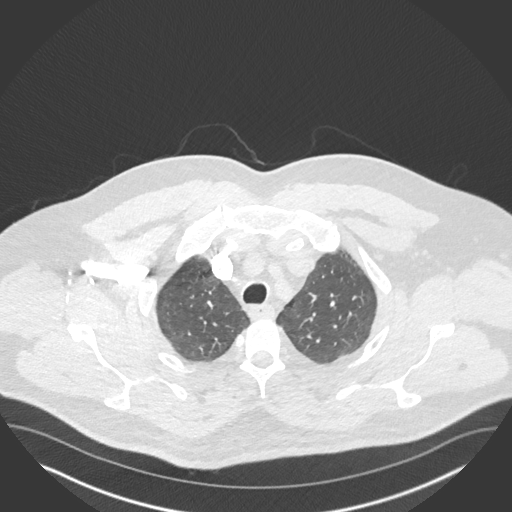
[im 366/419  mediastinal]
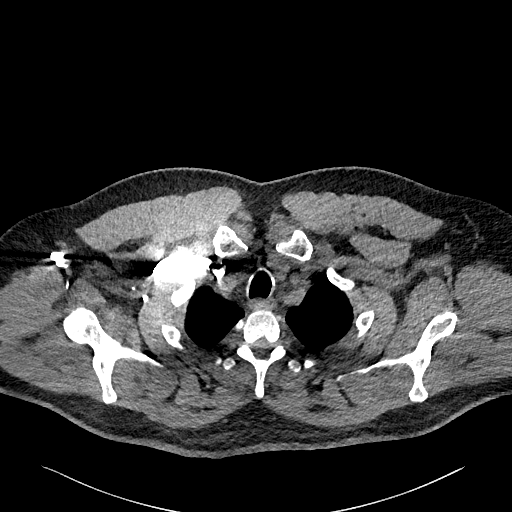
[im 392/419  lung]
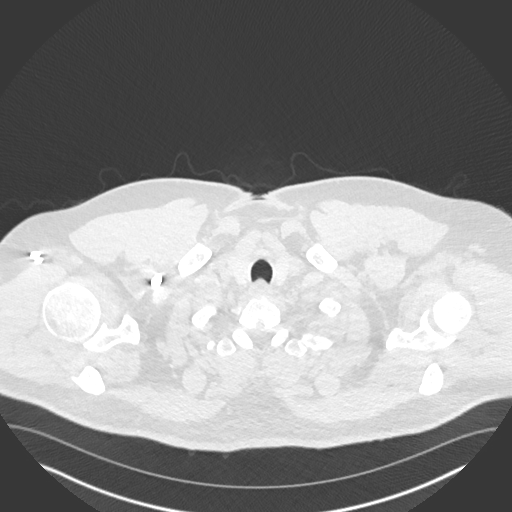

[Series 9: pe 2mm cor · coronal · 0.59mm/px · 1 of 158 slices shown]
[im 79/158  mediastinal]
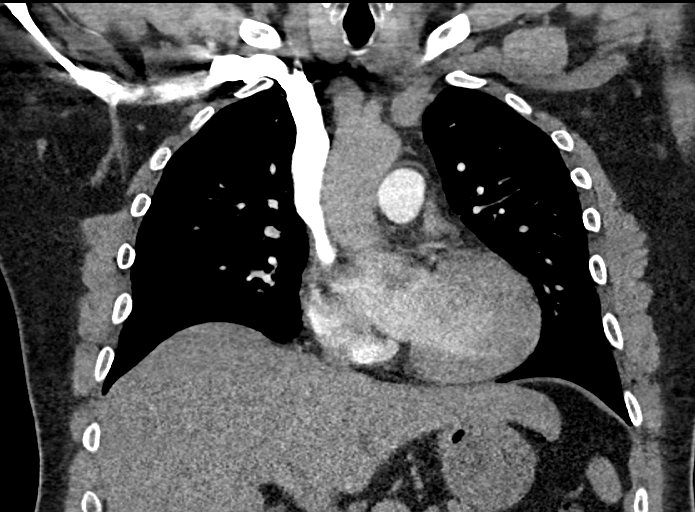

[18 of 36 positions shown; findings below may reference images not displayed]

FINDINGS: Cardiovascular: There is a optimal opacification of the pulmonary
arteries. There is no central,segmental, or subsegmental filling
defects within the pulmonary arteries. The heart is normal in size.
No pericardial effusion thickening. No evidence right heart strain.
There is normal three-vessel brachiocephalic anatomy without
proximal stenosis. The thoracic aorta is normal in appearance.

Mediastinum/Nodes: There is right hilar adenopathy measuring up to
1.7 cm in short axis diameter. Thyroid gland, trachea, and esophagus
demonstrate no significant findings.

Lungs/Pleura: There is patchy ground-glass opacity seen throughout
the right middle lobe and posterior right lower lung. There is a
more rounded consolidative patchy airspace opacity the posterior
right lower lung. A small amount of ground-glass opacity seen in the
posterior left lower lung. Subpleural blebs are seen within the
right lung apex.

Upper Abdomen: No acute abnormalities present in the visualized
portions of the upper abdomen.

Musculoskeletal: No chest wall abnormality. No acute or significant
osseous findings.

Review of the MIP images confirms the above findings.
IMPRESSION: 1. Patchy ground-glass opacities predominantly within the right
middle lobe right lower lung. There are a spectrum of findings in
the lungs which can be seen with acute atypical infection (as well
as other non-infectious etiologies). In particular, viral pneumonia
(including [id number]) should be considered in the appropriate
clinical setting.
2. No central, segmental, or subsegmental pulmonary embolism.

## 2022-02-15 ENCOUNTER — Encounter: Payer: Self-pay | Admitting: Family Medicine

## 2022-02-15 ENCOUNTER — Ambulatory Visit: Payer: Commercial Managed Care - PPO | Admitting: Family Medicine

## 2022-02-15 VITALS — BP 160/100 | HR 76 | Temp 97.6°F | Ht 73.0 in | Wt 258.0 lb

## 2022-02-15 DIAGNOSIS — I10 Essential (primary) hypertension: Secondary | ICD-10-CM

## 2022-02-15 MED ORDER — AMLODIPINE BESYLATE 5 MG PO TABS: 2.5000 mg | ORAL_TABLET | Freq: Every day | ORAL | 0 refills | Status: DC

## 2022-02-15 NOTE — Progress Notes (Unsigned)
1 PPD, d/w pt. encourage cessation.  He is considering.  Had BP elevation at DOT physical.  BP was up at ER eval in 09/2021.  He is working to lose weight intentionally.  BP elevated at OV back in 2021.    Hypertension:    No meds.  Chest pain with exertion:no Edema:no Short of breath:no  Meds, vitals, and allergies reviewed.   ROS: Per HPI unless specifically indicated in ROS section   GEN: nad, alert and oriented HEENT: ncat NECK: supple w/o LA CV: rrr. PULM: ctab, no inc wob ABD: soft, +bs EXT: no edema SKIN: Well-perfused.

## 2022-02-15 NOTE — Patient Instructions (Addendum)
Go to the lab on the way out.   If you have mychart we'll likely use that to update you.    Take care.  Glad to see you. Try to quit/cut back on smoking and use less salt.   Amlodipine- 1/2 tab a day for 10 days.  If BP is still above 140/90 then increase to '5mg'$  a day.  Update [place] at the end of the month, sooner if needed.

## 2022-02-17 DIAGNOSIS — I10 Essential (primary) hypertension: Secondary | ICD-10-CM | POA: Insufficient documentation

## 2022-02-17 NOTE — Assessment & Plan Note (Signed)
Advised to try to quit/cut back on smoking and use less salt.   Start amlodipine- 1/2 tab a day for 10 days.  If BP is still above 140/90 then increase to '5mg'$  a day.  Update [place] at the end of the month, sooner if needed.  He agrees with plan.  We do not want to overcorrect his blood pressure too quickly.  See notes on labs.

## 2022-02-19 ENCOUNTER — Telehealth: Payer: Self-pay

## 2022-02-19 LAB — LIPID PANEL
Chol/HDL Ratio: 5.8 ratio — ABNORMAL HIGH (ref 0.0–5.0)
Cholesterol, Total: 245 mg/dL — ABNORMAL HIGH (ref 100–199)
HDL: 42 mg/dL (ref >39)
LDL Chol Calc (NIH): 173 mg/dL — ABNORMAL HIGH (ref 0–99)
Triglycerides: 161 mg/dL — ABNORMAL HIGH (ref 0–149)
VLDL Cholesterol Cal: 30 mg/dL (ref 5–40)

## 2022-02-19 LAB — COMPREHENSIVE METABOLIC PANEL
ALT: 14 IU/L (ref 0–44)
AST: 13 IU/L (ref 0–40)
Albumin/Globulin Ratio: 1.8 (ref 1.2–2.2)
Albumin: 4.4 g/dL (ref 3.8–4.9)
Alkaline Phosphatase: 148 IU/L — ABNORMAL HIGH (ref 44–121)
BUN/Creatinine Ratio: 13 (ref 9–20)
BUN: 10 mg/dL (ref 6–24)
Bilirubin Total: 0.6 mg/dL (ref 0.0–1.2)
CO2: 19 mmol/L — ABNORMAL LOW (ref 20–29)
Calcium: 9 mg/dL (ref 8.7–10.2)
Chloride: 100 mmol/L (ref 96–106)
Creatinine, Ser: 0.75 mg/dL — ABNORMAL LOW (ref 0.76–1.27)
Globulin, Total: 2.5 g/dL (ref 1.5–4.5)
Glucose: 76 mg/dL (ref 70–99)
Potassium: 4.1 mmol/L (ref 3.5–5.2)
Sodium: 141 mmol/L (ref 134–144)
Total Protein: 6.9 g/dL (ref 6.0–8.5)
eGFR: 105 mL/min/{1.73_m2} (ref >59)

## 2022-02-19 LAB — SPECIMEN STATUS REPORT

## 2022-02-19 NOTE — Telephone Encounter (Signed)
Gave message below

## 2022-02-19 NOTE — Telephone Encounter (Signed)
I appreciate this but I'm not taking new patients currently.

## 2022-02-19 NOTE — Telephone Encounter (Signed)
Patient is calling in asking to do a transfer from [place] to [place].

## 2022-02-20 ENCOUNTER — Ambulatory Visit: Payer: Commercial Managed Care - PPO | Admitting: Family Medicine

## 2022-02-26 NOTE — Progress Notes (Signed)
    Brandon Edwards T. Brandon Tosh, MD, [facility] [place], [address]  Phone: [phone]  FAX: [phone]  Brandon Edwards - 56 y.o. male  MRN [id number]  Date of Birth: [birth year]  Date: 02/27/2022  PCP: Brandon Loffler, MD  Referral: Brandon Loffler, MD  No chief complaint on file.  Subjective:   Brandon Edwards is a 56 y.o. very pleasant male patient with There is no height or weight on file to calculate BMI. who presents with the following:  Follow-up on high blood pressure.  He recently saw my partner Dr. Damita Dunnings, and he was started on amlodipine 5 mg. He is here today in follow-up for recheck.    Review of Systems is noted in the HPI, as appropriate  Objective:   There were no vitals taken for this visit.  GEN: No acute distress; alert,appropriate. PULM: Breathing comfortably in no respiratory distress PSYCH: Normally interactive.   Laboratory and Imaging Data:  Assessment and Plan:   ***

## 2022-02-27 ENCOUNTER — Ambulatory Visit: Payer: Commercial Managed Care - PPO | Admitting: Family Medicine

## 2022-02-27 ENCOUNTER — Encounter: Payer: Self-pay | Admitting: Family Medicine

## 2022-02-27 VITALS — BP 150/90 | HR 78 | Temp 97.9°F | Ht 73.0 in | Wt 256.1 lb

## 2022-02-27 DIAGNOSIS — I10 Essential (primary) hypertension: Secondary | ICD-10-CM

## 2022-02-27 MED ORDER — AMLODIPINE BESYLATE 10 MG PO TABS
10.0000 mg | ORAL_TABLET | Freq: Every day | ORAL | 1 refills | Status: DC
Start: 2022-02-27 — End: 2022-09-16

## 2022-02-27 NOTE — Patient Instructions (Addendum)
Check your blood pressure once a day right now, right down the numbers and email to me in 2 weeks.   Follow-up in the office in 3 weeks when you are back in town.

## 2022-04-05 ENCOUNTER — Encounter: Payer: Self-pay | Admitting: Family Medicine

## 2022-04-08 MED ORDER — LISINOPRIL 10 MG PO TABS: 10.0000 mg | ORAL_TABLET | Freq: Every day | ORAL | 3 refills | Status: DC

## 2022-05-08 ENCOUNTER — Other Ambulatory Visit: Payer: Self-pay | Admitting: Family Medicine

## 2022-05-13 ENCOUNTER — Encounter: Payer: Self-pay | Admitting: Family Medicine

## 2022-05-14 MED ORDER — LISINOPRIL 40 MG PO TABS: 40.0000 mg | ORAL_TABLET | Freq: Every day | ORAL | 3 refills | Status: DC

## 2022-07-03 ENCOUNTER — Encounter: Payer: Self-pay | Admitting: Gastroenterology

## 2022-09-16 ENCOUNTER — Telehealth: Payer: Self-pay | Admitting: Family Medicine

## 2022-09-16 NOTE — Telephone Encounter (Signed)
Please call and schedule CPE with fasting labs prior with Dr. Copland.  

## 2022-09-16 NOTE — Telephone Encounter (Signed)
Patient scheduled.

## 2022-09-22 ENCOUNTER — Other Ambulatory Visit: Payer: Self-pay | Admitting: Family Medicine

## 2022-09-22 DIAGNOSIS — E782 Mixed hyperlipidemia: Secondary | ICD-10-CM

## 2022-09-22 DIAGNOSIS — Z79899 Other long term (current) drug therapy: Secondary | ICD-10-CM

## 2022-09-22 DIAGNOSIS — Z114 Encounter for screening for human immunodeficiency virus [HIV]: Secondary | ICD-10-CM

## 2022-09-22 DIAGNOSIS — Z125 Encounter for screening for malignant neoplasm of prostate: Secondary | ICD-10-CM

## 2022-09-22 DIAGNOSIS — Z1159 Encounter for screening for other viral diseases: Secondary | ICD-10-CM

## 2022-09-22 DIAGNOSIS — Z131 Encounter for screening for diabetes mellitus: Secondary | ICD-10-CM

## 2022-09-24 NOTE — Telephone Encounter (Addendum)
Lab orders have been updated to go to [facility] notified of this via telephone.

## 2022-09-24 NOTE — Telephone Encounter (Signed)
Patient called in and stated that he has his labs scheduled for tomorrow morning. He wanted to know if something will need to be changed as his wife is an Armed forces logistics/support/administrative officer. Please advise. Thank you!

## 2022-09-24 NOTE — Addendum Note (Signed)
Addended by: Carter Kitten on: 09/24/2022 10:32 AM   Modules accepted: Orders

## 2022-09-25 ENCOUNTER — Other Ambulatory Visit (INDEPENDENT_AMBULATORY_CARE_PROVIDER_SITE_OTHER): Payer: Commercial Managed Care - PPO

## 2022-09-25 DIAGNOSIS — Z114 Encounter for screening for human immunodeficiency virus [HIV]: Secondary | ICD-10-CM | POA: Diagnosis not present

## 2022-09-25 DIAGNOSIS — E782 Mixed hyperlipidemia: Secondary | ICD-10-CM

## 2022-09-25 DIAGNOSIS — Z1159 Encounter for screening for other viral diseases: Secondary | ICD-10-CM

## 2022-09-25 DIAGNOSIS — Z79899 Other long term (current) drug therapy: Secondary | ICD-10-CM

## 2022-09-25 DIAGNOSIS — Z131 Encounter for screening for diabetes mellitus: Secondary | ICD-10-CM

## 2022-09-26 LAB — CBC WITH DIFFERENTIAL/PLATELET

## 2022-09-27 LAB — CBC WITH DIFFERENTIAL/PLATELET
Basophils Absolute: 0.2 10*3/uL (ref 0.0–0.2)
Basos: 2 %
EOS (ABSOLUTE): 0.3 10*3/uL (ref 0.0–0.4)
Eos: 4 %
Hematocrit: 50.9 % (ref 37.5–51.0)
Hemoglobin: 16.9 g/dL (ref 13.0–17.7)
Immature Grans (Abs): 0 10*3/uL (ref 0.0–0.1)
Immature Granulocytes: 0 %
Lymphocytes Absolute: 3 10*3/uL (ref 0.7–3.1)
Lymphs: 37 %
MCH: 29.4 pg (ref 26.6–33.0)
MCHC: 33.2 g/dL (ref 31.5–35.7)
MCV: 89 fL (ref 79–97)
Monocytes Absolute: 0.6 10*3/uL (ref 0.1–0.9)
Monocytes: 8 %
Neutrophils Absolute: 4 10*3/uL (ref 1.4–7.0)
Neutrophils: 49 %
Platelets: 114 10*3/uL — ABNORMAL LOW (ref 150–450)
RBC: 5.75 x10E6/uL (ref 4.14–5.80)
RDW: 13.4 % (ref 11.6–15.4)
WBC: 8.2 10*3/uL (ref 3.4–10.8)

## 2022-09-27 LAB — BASIC METABOLIC PANEL
BUN/Creatinine Ratio: 12 (ref 9–20)
BUN: 11 mg/dL (ref 6–24)
CO2: 24 mmol/L (ref 20–29)
Calcium: 9.2 mg/dL (ref 8.7–10.2)
Chloride: 104 mmol/L (ref 96–106)
Creatinine, Ser: 0.93 mg/dL (ref 0.76–1.27)
Glucose: 118 mg/dL — ABNORMAL HIGH (ref 70–99)
Potassium: 4.9 mmol/L (ref 3.5–5.2)
Sodium: 143 mmol/L (ref 134–144)
eGFR: 96 mL/min/{1.73_m2} (ref >59)

## 2022-09-27 LAB — HEPATIC FUNCTION PANEL
ALT: 19 IU/L (ref 0–44)
AST: 14 IU/L (ref 0–40)
Albumin: 4.5 g/dL (ref 3.8–4.9)
Alkaline Phosphatase: 138 IU/L — ABNORMAL HIGH (ref 44–121)
Bilirubin Total: 0.3 mg/dL (ref 0.0–1.2)
Bilirubin, Direct: <0.1 mg/dL (ref 0.00–0.40)
Total Protein: 7 g/dL (ref 6.0–8.5)

## 2022-09-27 LAB — LIPID PANEL
Chol/HDL Ratio: 5.7 ratio — ABNORMAL HIGH (ref 0.0–5.0)
Cholesterol, Total: 247 mg/dL — ABNORMAL HIGH (ref 100–199)
HDL: 43 mg/dL (ref >39)
LDL Chol Calc (NIH): 170 mg/dL — ABNORMAL HIGH (ref 0–99)
Triglycerides: 183 mg/dL — ABNORMAL HIGH (ref 0–149)
VLDL Cholesterol Cal: 34 mg/dL (ref 5–40)

## 2022-09-27 LAB — HEMOGLOBIN A1C
Est. average glucose Bld gHb Est-mCnc: 120 mg/dL
Hgb A1c MFr Bld: 5.8 % — ABNORMAL HIGH (ref 4.8–5.6)

## 2022-09-27 LAB — PSA TOTAL (REFLEX TO FREE): Prostate Specific Ag, Serum: 0.4 ng/mL (ref 0.0–4.0)

## 2022-09-27 LAB — HIV ANTIBODY (ROUTINE TESTING W REFLEX): HIV Screen 4th Generation wRfx: NONREACTIVE

## 2022-09-27 LAB — HEPATITIS C ANTIBODY: Hep C Virus Ab: NONREACTIVE

## 2022-10-01 NOTE — Progress Notes (Unsigned)
Brandon Edwards T. Brandon Enyeart, MD, [place] at [facility] [place], [address]  Phone: [phone]  FAX: [phone]  Brandon Edwards - 57 y.o. male  MRN [id number]  Date of Birth: Dec 31, 1965  Date: 10/02/2022  PCP: Owens Loffler, MD  Referral: Owens Loffler, MD  No chief complaint on file.  Patient Care Team: Owens Loffler, MD as PCP - General Subjective:   Brandon Edwards is a 57 y.o. pleasant patient who presents with the following:  Preventative Health Maintenance Visit:  Health Maintenance Summary Reviewed and updated, unless pt declines services.  Tobacco History Reviewed. Alcohol: No concerns, no excessive use Exercise Habits: Some activity, rec at least 30 mins 5 times a week STD concerns: no risk or activity to increase risk Drug Use: None  Covid Shingrix Flu Smoker - lung cancer screen Colonoscopy - not due until 06/2024  Health Maintenance  Topic Date Due   COVID-19 Vaccine (1) Never done   Zoster Vaccines- Shingrix (1 of 2) Never done   Lung Cancer Screening  04/19/2020   INFLUENZA VACCINE  Never done   COLONOSCOPY (Pts 45-66yr Insurance coverage will need to be confirmed)  06/23/2022   DTaP/Tdap/Td (3 - Td or Tdap) 04/05/2030   Hepatitis C Screening  Completed   HIV Screening  Completed   HPV VACCINES  Aged Out   Immunization History  Administered Date(s) Administered   Td 04/05/2020   Tdap 03/08/2010   Patient Active Problem List   Diagnosis Date Noted   Essential hypertension 02/17/2022    Priority: Medium    Smoker 10/30/2011    Priority: Medium    Hyperlipidemia 11/09/2010    Priority: Medium    Varicose veins of both lower extremities with inflammation 03/11/2019   Personal history of malignant neoplasm of skin 11/12/2010    Past Medical History:  Diagnosis Date   BCC (basal cell carcinoma), face    Deviated septum    Hyperlipidemia    Sleep apnea     Smoker 10/30/2011    Past Surgical History:  Procedure Laterality Date   SEPTOPLASTY Bilateral 08/31/2020   Procedure: SEPTOPLASTY;  Surgeon: JMargaretha Sainsbury MD;  [place] [facility]  Service: E[place];  Laterality: Bilateral;   TURBINATE REDUCTION Right 08/31/2020   Procedure: TURBINATE REDUCTION;  Surgeon: JMargaretha Didio MD;  [place] [facility]  Service: ENT;  Laterality: Right;   VASECTOMY     cope   WISDOM TOOTH EXTRACTION     age 57   Family History  Problem Relation Age of Onset   Allergies Mother    Allergies Father    Allergies Brother    Allergies Brother    Skin cancer Paternal Grandfather    Colon cancer Neg Hx    Esophageal cancer Neg Hx    Pancreatic cancer Neg Hx    Stomach cancer Neg Hx     Social History   Social History Narrative   [facility] [place]      Past Medical History, Surgical History, Social History, Family History, Problem List, Medications, and Allergies have been reviewed and updated if relevant.  Review of Systems: Pertinent positives are listed above.  Otherwise, a full 14 point review of systems has been done in full and it is negative except where it is noted positive.  Objective:   There were no vitals taken for this visit. Ideal Body Weight:    Ideal  Body Weight:   No results found.    02/27/2022    9:19 AM 04/05/2020   10:27 AM 02/15/2019    8:48 AM  Depression screen PHQ 2/9  Decreased Interest 0 0 0  Down, Depressed, Hopeless 0 0 0  PHQ - 2 Score 0 0 0     GEN: well developed, well nourished, no acute distress Eyes: conjunctiva and lids normal, PERRLA, EOMI ENT: TM clear, nares clear, oral exam WNL Neck: supple, no lymphadenopathy, no thyromegaly, no JVD Pulm: clear to auscultation and percussion, respiratory effort normal CV: regular rate and rhythm, S1-S2, no murmur, rub or gallop, no bruits, peripheral pulses normal and symmetric, no cyanosis, clubbing, edema or  varicosities GI: soft, non-tender; no hepatosplenomegaly, masses; active bowel sounds all quadrants GU: deferred Lymph: no cervical, axillary or inguinal adenopathy MSK: gait normal, muscle tone and strength WNL, no joint swelling, effusions, discoloration, crepitus  SKIN: clear, good turgor, color WNL, no rashes, lesions, or ulcerations Neuro: normal mental status, normal strength, sensation, and motion Psych: alert; oriented to person, place and time, normally interactive and not anxious or depressed in appearance.  All labs reviewed with patient. Results for orders placed or performed in visit on 09/25/22  PSA Total (Reflex To Free)  Result Value Ref Range   Prostate Specific Ag, Serum 0.4 0.0 - 4.0 ng/mL   Reflex Criteria Comment   Lipid panel  Result Value Ref Range   Cholesterol, Total 247 (H) 100 - 199 mg/dL   Triglycerides 183 (H) 0 - 149 mg/dL   HDL 43 >39 mg/dL   VLDL Cholesterol Cal 34 5 - 40 mg/dL   LDL Chol Calc (NIH) 170 (H) 0 - 99 mg/dL   Chol/HDL Ratio 5.7 (H) 0.0 - 5.0 ratio  HIV Antibody (routine testing w rflx)  Result Value Ref Range   HIV Screen 4th Generation wRfx Non Reactive Non Reactive  Hepatitis C antibody  Result Value Ref Range   Hep C Virus Ab Non Reactive Non Reactive  Hepatic function panel  Result Value Ref Range   Total Protein 7.0 6.0 - 8.5 g/dL   Albumin 4.5 3.8 - 4.9 g/dL   Bilirubin Total 0.3 0.0 - 1.2 mg/dL   Bilirubin, Direct <0.10 0.00 - 0.40 mg/dL   Alkaline Phosphatase 138 (H) 44 - 121 IU/L   AST 14 0 - 40 IU/L   ALT 19 0 - 44 IU/L  Hemoglobin A1c  Result Value Ref Range   Hgb A1c MFr Bld 5.8 (H) 4.8 - 5.6 %   Est. average glucose Bld gHb Est-mCnc 120 mg/dL  CBC with Differential/Platelet  Result Value Ref Range   WBC 8.2 3.4 - 10.8 x10E3/uL   RBC 5.75 4.14 - 5.80 x10E6/uL   Hemoglobin 16.9 13.0 - 17.7 g/dL   Hematocrit 50.9 37.5 - 51.0 %   MCV 89 79 - 97 fL   MCH 29.4 26.6 - 33.0 pg   MCHC 33.2 31.5 - 35.7 g/dL   RDW 13.4  11.6 - 15.4 %   Platelets 114 (L) 150 - 450 x10E3/uL   Neutrophils 49 Not Estab. %   Lymphs 37 Not Estab. %   Monocytes 8 Not Estab. %   Eos 4 Not Estab. %   Basos 2 Not Estab. %   Neutrophils Absolute 4.0 1.4 - 7.0 x10E3/uL   Lymphocytes Absolute 3.0 0.7 - 3.1 x10E3/uL   Monocytes Absolute 0.6 0.1 - 0.9 x10E3/uL   EOS (ABSOLUTE) 0.3 0.0 - 0.4 x10E3/uL  Basophils Absolute 0.2 0.0 - 0.2 x10E3/uL   Immature Granulocytes 0 Not Estab. %   Immature Grans (Abs) 0.0 0.0 - 0.1 x10E3/uL   Hematology Comments: Note:   Basic metabolic panel  Result Value Ref Range   Glucose 118 (H) 70 - 99 mg/dL   BUN 11 6 - 24 mg/dL   Creatinine, Ser 0.93 0.76 - 1.27 mg/dL   eGFR 96 >59 mL/min/1.73   BUN/Creatinine Ratio 12 9 - 20   Sodium 143 134 - 144 mmol/L   Potassium 4.9 3.5 - 5.2 mmol/L   Chloride 104 96 - 106 mmol/L   CO2 24 20 - 29 mmol/L   Calcium 9.2 8.7 - 10.2 mg/dL    Assessment and Plan:     ICD-10-CM   1. Healthcare maintenance  Z00.00       Health Maintenance Exam: The patient's preventative maintenance and recommended screening tests for an annual wellness exam were reviewed in full today. Brought up to date unless services declined.  Counselled on the importance of diet, exercise, and its role in overall health and mortality. The patient's FH and SH was reviewed, including their home life, tobacco status, and drug and alcohol status.  Follow-up in 1 year for physical exam or additional follow-up below.  Disposition: No follow-ups on file.  No orders of the defined types were placed in this encounter.  There are no discontinued medications. No orders of the defined types were placed in this encounter.   Signed,  Maud Deed. Andie Mungin, MD   Allergies as of 10/02/2022   No Known Allergies      Medication List        Accurate as of October 01, 2022  1:54 PM. If you have any questions, ask your nurse or doctor.          amLODipine 10 MG tablet Commonly known  as: NORVASC TAKE 1 TABLET BY MOUTH EVERY DAY   lisinopril 40 MG tablet Commonly known as: ZESTRIL Take 1 tablet (40 mg total) by mouth daily.   multivitamin capsule Take 1 capsule by mouth daily.

## 2022-10-02 ENCOUNTER — Ambulatory Visit (INDEPENDENT_AMBULATORY_CARE_PROVIDER_SITE_OTHER): Payer: Commercial Managed Care - PPO | Admitting: Family Medicine

## 2022-10-02 ENCOUNTER — Encounter: Payer: Self-pay | Admitting: Family Medicine

## 2022-10-02 VITALS — BP 160/80 | HR 73 | Temp 97.5°F | Ht 72.75 in | Wt 269.1 lb

## 2022-10-02 DIAGNOSIS — Z0000 Encounter for general adult medical examination without abnormal findings: Secondary | ICD-10-CM | POA: Diagnosis not present

## 2022-10-02 MED ORDER — SILDENAFIL CITRATE 100 MG PO TABS: 50.0000 mg | ORAL_TABLET | Freq: Every day | ORAL | 11 refills | Status: AC | PRN

## 2022-10-02 MED ORDER — LOSARTAN POTASSIUM 25 MG PO TABS: 25.0000 mg | ORAL_TABLET | Freq: Every day | ORAL | 2 refills | Status: DC

## 2022-10-25 ENCOUNTER — Other Ambulatory Visit: Payer: Self-pay | Admitting: Family Medicine

## 2022-10-25 NOTE — Telephone Encounter (Signed)
Please call and schedule:   Return in about 6 weeks (around 11/13/2022) for follow-up with Dr. Lorelei Pont blood pressure.

## 2022-10-28 NOTE — Telephone Encounter (Signed)
Pt stated he'd give [place] a call back to schedule

## 2022-12-16 ENCOUNTER — Other Ambulatory Visit: Payer: Self-pay | Admitting: Family Medicine
